# Patient Record
Sex: Female | Born: 1965 | Race: White | Hispanic: No | Smoking: Never smoker
Health system: Southern US, Community
[De-identification: ages and names within clinical notes are randomized; demographics above are authoritative.]

## PROBLEM LIST (undated history)

## (undated) DIAGNOSIS — D649 Anemia, unspecified: Secondary | ICD-10-CM

## (undated) DIAGNOSIS — K635 Polyp of colon: Secondary | ICD-10-CM

## (undated) DIAGNOSIS — N819 Female genital prolapse, unspecified: Secondary | ICD-10-CM

## (undated) DIAGNOSIS — K5090 Crohn's disease, unspecified, without complications: Secondary | ICD-10-CM

## (undated) HISTORY — PX: HERNIA REPAIR: SHX51

## (undated) HISTORY — PX: BREAST SURGERY: SHX581

## (undated) HISTORY — PX: COLPORRHAPHY: SHX921

## (undated) HISTORY — DX: Polyp of colon: K63.5

## (undated) HISTORY — PX: ABDOMINAL HYSTERECTOMY: SHX81

## (undated) HISTORY — PX: MANDIBLE SURGERY: SHX707

---

## 1998-08-05 ENCOUNTER — Other Ambulatory Visit: Admission: RE | Admit: 1998-08-05 | Discharge: 1998-08-05 | Payer: Self-pay | Admitting: Obstetrics and Gynecology

## 1999-02-20 ENCOUNTER — Other Ambulatory Visit: Admission: RE | Admit: 1999-02-20 | Discharge: 1999-02-20 | Payer: Self-pay | Admitting: Obstetrics and Gynecology

## 1999-09-23 ENCOUNTER — Inpatient Hospital Stay (HOSPITAL_COMMUNITY): Admission: AD | Admit: 1999-09-23 | Discharge: 1999-09-25 | Payer: Self-pay | Admitting: Obstetrics and Gynecology

## 1999-10-28 ENCOUNTER — Other Ambulatory Visit: Admission: RE | Admit: 1999-10-28 | Discharge: 1999-10-28 | Payer: Self-pay | Admitting: Obstetrics and Gynecology

## 1999-12-02 ENCOUNTER — Encounter (INDEPENDENT_AMBULATORY_CARE_PROVIDER_SITE_OTHER): Payer: Self-pay

## 1999-12-02 ENCOUNTER — Other Ambulatory Visit: Admission: RE | Admit: 1999-12-02 | Discharge: 1999-12-02 | Payer: Self-pay | Admitting: Obstetrics and Gynecology

## 2000-12-01 ENCOUNTER — Other Ambulatory Visit: Admission: RE | Admit: 2000-12-01 | Discharge: 2000-12-01 | Payer: Self-pay | Admitting: Obstetrics and Gynecology

## 2001-12-04 ENCOUNTER — Other Ambulatory Visit: Admission: RE | Admit: 2001-12-04 | Discharge: 2001-12-04 | Payer: Self-pay | Admitting: Obstetrics & Gynecology

## 2002-02-22 ENCOUNTER — Encounter: Admission: RE | Admit: 2002-02-22 | Discharge: 2002-02-22 | Payer: Self-pay | Admitting: Obstetrics and Gynecology

## 2002-02-22 ENCOUNTER — Encounter: Payer: Self-pay | Admitting: Obstetrics and Gynecology

## 2002-12-06 ENCOUNTER — Other Ambulatory Visit: Admission: RE | Admit: 2002-12-06 | Discharge: 2002-12-06 | Payer: Self-pay | Admitting: Obstetrics & Gynecology

## 2003-03-08 ENCOUNTER — Encounter: Payer: Self-pay | Admitting: Obstetrics and Gynecology

## 2003-03-08 ENCOUNTER — Encounter: Admission: RE | Admit: 2003-03-08 | Discharge: 2003-03-08 | Payer: Self-pay | Admitting: Obstetrics and Gynecology

## 2004-12-18 ENCOUNTER — Ambulatory Visit: Payer: Self-pay | Admitting: Unknown Physician Specialty

## 2008-03-05 ENCOUNTER — Encounter: Admission: RE | Admit: 2008-03-05 | Discharge: 2008-03-05 | Payer: Self-pay | Admitting: Obstetrics and Gynecology

## 2009-05-08 ENCOUNTER — Encounter: Admission: RE | Admit: 2009-05-08 | Discharge: 2009-05-08 | Payer: Self-pay | Admitting: Obstetrics and Gynecology

## 2009-05-16 ENCOUNTER — Ambulatory Visit: Payer: Self-pay | Admitting: Unknown Physician Specialty

## 2009-10-08 ENCOUNTER — Other Ambulatory Visit: Payer: Self-pay | Admitting: Unknown Physician Specialty

## 2009-10-22 ENCOUNTER — Other Ambulatory Visit: Payer: Self-pay

## 2009-11-20 ENCOUNTER — Other Ambulatory Visit: Payer: Self-pay | Admitting: Unknown Physician Specialty

## 2010-01-12 ENCOUNTER — Other Ambulatory Visit: Payer: Self-pay | Admitting: Unknown Physician Specialty

## 2010-02-16 ENCOUNTER — Other Ambulatory Visit: Payer: Self-pay | Admitting: Unknown Physician Specialty

## 2010-04-09 ENCOUNTER — Ambulatory Visit: Payer: Self-pay | Admitting: Internal Medicine

## 2010-04-17 ENCOUNTER — Ambulatory Visit: Payer: Self-pay | Admitting: Internal Medicine

## 2010-05-09 ENCOUNTER — Ambulatory Visit: Payer: Self-pay | Admitting: Internal Medicine

## 2010-05-22 ENCOUNTER — Encounter: Admission: RE | Admit: 2010-05-22 | Discharge: 2010-05-22 | Payer: Self-pay | Admitting: Obstetrics and Gynecology

## 2010-06-09 ENCOUNTER — Ambulatory Visit: Payer: Self-pay | Admitting: Internal Medicine

## 2010-07-10 ENCOUNTER — Ambulatory Visit: Payer: Self-pay | Admitting: Internal Medicine

## 2010-08-09 ENCOUNTER — Ambulatory Visit: Payer: Self-pay | Admitting: Internal Medicine

## 2011-04-20 ENCOUNTER — Other Ambulatory Visit: Payer: Self-pay | Admitting: Obstetrics and Gynecology

## 2011-04-20 DIAGNOSIS — Z1231 Encounter for screening mammogram for malignant neoplasm of breast: Secondary | ICD-10-CM

## 2011-05-25 ENCOUNTER — Ambulatory Visit
Admission: RE | Admit: 2011-05-25 | Discharge: 2011-05-25 | Disposition: A | Discharge status: Home / Self Care | Payer: PRIVATE HEALTH INSURANCE | Source: Ambulatory Visit | Attending: Obstetrics and Gynecology | Admitting: Obstetrics and Gynecology

## 2011-05-25 DIAGNOSIS — Z1231 Encounter for screening mammogram for malignant neoplasm of breast: Secondary | ICD-10-CM

## 2012-06-30 ENCOUNTER — Other Ambulatory Visit: Payer: Self-pay | Admitting: Obstetrics and Gynecology

## 2012-06-30 DIAGNOSIS — Z1231 Encounter for screening mammogram for malignant neoplasm of breast: Secondary | ICD-10-CM

## 2012-08-08 ENCOUNTER — Ambulatory Visit
Admission: RE | Admit: 2012-08-08 | Discharge: 2012-08-08 | Disposition: A | Discharge status: Home / Self Care | Payer: PRIVATE HEALTH INSURANCE | Source: Ambulatory Visit | Attending: Obstetrics and Gynecology | Admitting: Obstetrics and Gynecology

## 2012-08-08 DIAGNOSIS — Z1231 Encounter for screening mammogram for malignant neoplasm of breast: Secondary | ICD-10-CM

## 2012-11-06 ENCOUNTER — Other Ambulatory Visit: Payer: Self-pay | Admitting: Obstetrics and Gynecology

## 2012-11-11 ENCOUNTER — Encounter (HOSPITAL_COMMUNITY): Payer: Self-pay | Admitting: Pharmacy Technician

## 2012-11-13 ENCOUNTER — Encounter (HOSPITAL_COMMUNITY)
Admission: RE | Admit: 2012-11-13 | Discharge: 2012-11-13 | Disposition: A | Discharge status: Home / Self Care | Payer: PRIVATE HEALTH INSURANCE | Source: Ambulatory Visit | Attending: Obstetrics and Gynecology | Admitting: Obstetrics and Gynecology

## 2012-11-13 ENCOUNTER — Encounter (HOSPITAL_COMMUNITY): Payer: Self-pay

## 2012-11-13 HISTORY — DX: Crohn's disease, unspecified, without complications: K50.90

## 2012-11-13 LAB — COMPREHENSIVE METABOLIC PANEL
CO2: 28 mEq/L (ref 19–32)
Calcium: 9.3 mg/dL (ref 8.4–10.5)
Creatinine, Ser: 0.59 mg/dL (ref 0.50–1.10)
GFR calc Af Amer: >90 mL/min (ref >90)
GFR calc non Af Amer: >90 mL/min (ref >90)
Glucose, Bld: 74 mg/dL (ref 70–99)

## 2012-11-13 LAB — CBC
Hemoglobin: 13.9 g/dL (ref 12.0–15.0)
MCH: 30.7 pg (ref 26.0–34.0)
MCV: 94.9 fL (ref 78.0–100.0)
RBC: 4.53 MIL/uL (ref 3.87–5.11)

## 2012-11-13 LAB — SURGICAL PCR SCREEN: Staphylococcus aureus: INVALID — AB

## 2012-11-13 NOTE — Patient Instructions (Addendum)
20 AMBRA HAVERSTICK  11/13/2012   Your procedure is scheduled on:  11/17/12  Enter through the Main Entrance of [facility] at [place] up the phone at the desk and dial [phone].   Call this number if you have problems the morning of surgery: [phone]   Remember:   Do not eat food:After Midnight.  Do not drink clear liquids: After Midnight.  Take these medicines the morning of surgery with A SIP OF WATER: NA   Do not wear jewelry, make-up or nail polish.  Do not wear lotions, powders, or perfumes. You may wear deodorant.  Do not shave 48 hours prior to surgery.  Do not bring valuables to the hospital.  Contacts, dentures or bridgework may not be worn into surgery.  Leave suitcase in the car. After surgery it may be brought to your room.  For patients admitted to the hospital, checkout time is 11:00 AM the day of discharge.   Patients discharged the day of surgery will not be allowed to drive home.  Name and phone number of your driver: NA  Special Instructions: Shower using CHG 2 nights before surgery and the night before surgery.  If you shower the day of surgery use CHG.  Use special wash - you have one bottle of CHG for all showers.  You should use approximately 1/3 of the bottle for each shower.   Please read over the following fact sheets that you were given: MRSA Information

## 2012-11-15 LAB — MRSA CULTURE

## 2012-11-16 NOTE — H&P (Signed)
[name], [name]                ACCOUNT NO.:  [id number]  MEDICAL RECORD NO.:  [id number]  LOCATION:  PERIO                         FACILITY:  [facility]  PHYSICIAN:  Lovenia Kim, M.D.DATE OF BIRTH:  05/20/1966  DATE OF ADMISSION:  09/29/2012 DATE OF DISCHARGE:                             HISTORY & PHYSICAL   CHIEF COMPLAINT:  Stress urinary incontinence, pelvic pain, symptomatic cystocele and rectocele for surgical intervention.  HISTORY OF PRESENT ILLNESS:  She is a 47 year old white female, G2, P2, who presents with aforementioned symptomatology for surgical intervention.  ALLERGIES:  She has allergies to SULFA.  MEDICATIONS:  Calcium, Asacol, and mercaptopurine.  She has a personal history of urinary incontinence, pelvic relaxation as noted.  FAMILY HISTORY:  Neurovascular disease, myocardial infarction, rheumatoid arthritis.  History of vaginal delivery x2.  Surgical history remarkable for laser vaporization surgery.  PHYSICAL EXAMINATION:  GENERAL:  Well-developed, well-nourished white female. VITAL SIGNS:  Height is 65 inches, weight 170 pounds. HEENT:  Normal. NECK:  Supple.  Full range of motion. LUNGS:  Clear to auscultation. HEART:  Regular rhythm. ABDOMEN:  Soft, nontender. PELVIC:  Retroflexed bulky uterus.  No adnexal masses.  Multiple small fibroids noted on ultrasound.  Grade 1-2 cystocele, grade 1-2 rectocele and previous history of positive Q-Tip test. EXTREMITIES:  There are no cords. NEUROLOGIC:  Nonfocal. SKIN:  Intact.  IMPRESSION: 1. Stress urinary incontinence. 2. Pelvic relaxation with cystocele, rectocele. 3. Uterine fibroids.  PLAN:  Proceed with da Vinci total laparoscopic hysterectomy, bilateral salpingectomy, anterior repair, posterior repair, TVT-Exact.  Risks of anesthesia, infection, bleeding, injury to abdominal organs, need for repair was discussed, delayed versus immediate complications to include bowel and bladder injury,  possible need for repair noted.  Small risk of postoperative urge incontinence and urinary retention discussed.  Mesh complications with a 5-4% risk of erosion were stated.  The patient acknowledges and wishes to proceed.     Lovenia Kim, M.D.     RJT/MEDQ  D:  11/16/2012  T:  11/16/2012  Job:  [id number]

## 2012-11-17 ENCOUNTER — Encounter (HOSPITAL_COMMUNITY): Payer: Self-pay | Admitting: Anesthesiology

## 2012-11-17 ENCOUNTER — Encounter (HOSPITAL_COMMUNITY)
Admission: RE | Disposition: A | Discharge status: Home / Self Care | Payer: Self-pay | Source: Ambulatory Visit | Attending: Obstetrics and Gynecology

## 2012-11-17 ENCOUNTER — Ambulatory Visit (HOSPITAL_COMMUNITY)
Admission: RE | Admit: 2012-11-17 | Discharge: 2012-11-18 | Disposition: A | Discharge status: Home / Self Care | Payer: PRIVATE HEALTH INSURANCE | Source: Ambulatory Visit | Attending: Obstetrics and Gynecology | Admitting: Obstetrics and Gynecology

## 2012-11-17 ENCOUNTER — Encounter (HOSPITAL_COMMUNITY): Payer: Self-pay

## 2012-11-17 ENCOUNTER — Ambulatory Visit (HOSPITAL_COMMUNITY): Payer: PRIVATE HEALTH INSURANCE | Admitting: Anesthesiology

## 2012-11-17 DIAGNOSIS — IMO0002 Reserved for concepts with insufficient information to code with codable children: Secondary | ICD-10-CM | POA: Insufficient documentation

## 2012-11-17 DIAGNOSIS — N393 Stress incontinence (female) (male): Secondary | ICD-10-CM | POA: Insufficient documentation

## 2012-11-17 DIAGNOSIS — N812 Incomplete uterovaginal prolapse: Secondary | ICD-10-CM | POA: Insufficient documentation

## 2012-11-17 DIAGNOSIS — N949 Unspecified condition associated with female genital organs and menstrual cycle: Secondary | ICD-10-CM | POA: Insufficient documentation

## 2012-11-17 DIAGNOSIS — N9989 Other postprocedural complications and disorders of genitourinary system: Secondary | ICD-10-CM | POA: Insufficient documentation

## 2012-11-17 DIAGNOSIS — N8000 Endometriosis of the uterus, unspecified: Secondary | ICD-10-CM | POA: Insufficient documentation

## 2012-11-17 DIAGNOSIS — R339 Retention of urine, unspecified: Secondary | ICD-10-CM | POA: Insufficient documentation

## 2012-11-17 DIAGNOSIS — D252 Subserosal leiomyoma of uterus: Secondary | ICD-10-CM | POA: Insufficient documentation

## 2012-11-17 DIAGNOSIS — N815 Vaginal enterocele: Secondary | ICD-10-CM | POA: Insufficient documentation

## 2012-11-17 DIAGNOSIS — Z882 Allergy status to sulfonamides status: Secondary | ICD-10-CM | POA: Insufficient documentation

## 2012-11-17 DIAGNOSIS — D251 Intramural leiomyoma of uterus: Secondary | ICD-10-CM | POA: Insufficient documentation

## 2012-11-17 HISTORY — PX: BILATERAL SALPINGECTOMY: SHX5743

## 2012-11-17 HISTORY — PX: ANTERIOR AND POSTERIOR REPAIR: SHX5121

## 2012-11-17 HISTORY — PX: ROBOTIC ASSISTED TOTAL HYSTERECTOMY: SHX6085

## 2012-11-17 HISTORY — PX: BLADDER SUSPENSION: SHX72

## 2012-11-17 SURGERY — ROBOTIC ASSISTED TOTAL HYSTERECTOMY
Anesthesia: General | Site: Vagina | Wound class: Clean Contaminated

## 2012-11-17 MED ORDER — BUPIVACAINE-EPINEPHRINE 0.25% -1:200000 IJ SOLN
INTRAMUSCULAR | Status: DC | PRN
  Administered 2012-11-17: 90 mL

## 2012-11-17 MED ORDER — NEOSTIGMINE METHYLSULFATE 1 MG/ML IJ SOLN
INTRAMUSCULAR | Status: DC | PRN
  Administered 2012-11-17: 3 mg via INTRAVENOUS

## 2012-11-17 MED ORDER — SCOPOLAMINE 1 MG/3DAYS TD PT72
1.0000 | MEDICATED_PATCH | Freq: Once | TRANSDERMAL | Status: AC | PRN
  Filled 2012-11-17: qty 1

## 2012-11-17 MED ORDER — FENTANYL CITRATE 0.05 MG/ML IJ SOLN
INTRAMUSCULAR | Status: DC | PRN
  Administered 2012-11-17 (×3): 50 ug via INTRAVENOUS
  Administered 2012-11-17: 100 ug via INTRAVENOUS

## 2012-11-17 MED ORDER — ROPIVACAINE HCL 5 MG/ML IJ SOLN
INTRAMUSCULAR | Status: DC | PRN
  Administered 2012-11-17: 70 mL

## 2012-11-17 MED ORDER — KETOROLAC TROMETHAMINE 30 MG/ML IJ SOLN
INTRAMUSCULAR | Status: AC
  Filled 2012-11-17: qty 1

## 2012-11-17 MED ORDER — PROPOFOL 10 MG/ML IV EMUL
INTRAVENOUS | Status: AC
  Filled 2012-11-17: qty 20

## 2012-11-17 MED ORDER — HYDROMORPHONE 0.3 MG/ML IV SOLN
INTRAVENOUS | Status: DC
  Administered 2012-11-17: 12:00:00 via INTRAVENOUS
  Administered 2012-11-17: 1 mg via INTRAVENOUS
  Administered 2012-11-17: 0.3 mg via INTRAVENOUS
  Administered 2012-11-18 (×2): 0.6 mg via INTRAVENOUS
  Filled 2012-11-17: qty 25

## 2012-11-17 MED ORDER — ONDANSETRON HCL 4 MG/2ML IJ SOLN
INTRAMUSCULAR | Status: AC
  Filled 2012-11-17: qty 2

## 2012-11-17 MED ORDER — ACETAMINOPHEN 10 MG/ML IV SOLN
1000.0000 mg | Freq: Once | INTRAVENOUS | Status: AC
  Administered 2012-11-17: 1000 mg via INTRAVENOUS

## 2012-11-17 MED ORDER — LACTATED RINGERS IV SOLN
INTRAVENOUS | Status: DC
  Administered 2012-11-17 (×4): via INTRAVENOUS

## 2012-11-17 MED ORDER — ZOLPIDEM TARTRATE 5 MG PO TABS: 5.0000 mg | ORAL_TABLET | Freq: Every evening | ORAL | Status: DC | PRN

## 2012-11-17 MED ORDER — MIDAZOLAM HCL 5 MG/5ML IJ SOLN
INTRAMUSCULAR | Status: DC | PRN
  Administered 2012-11-17: 2 mg via INTRAVENOUS

## 2012-11-17 MED ORDER — CEFAZOLIN SODIUM-DEXTROSE 2-3 GM-% IV SOLR
INTRAVENOUS | Status: AC
  Filled 2012-11-17: qty 50

## 2012-11-17 MED ORDER — DIPHENHYDRAMINE HCL 50 MG/ML IJ SOLN: 12.5000 mg | Freq: Four times a day (QID) | INTRAMUSCULAR | Status: DC | PRN

## 2012-11-17 MED ORDER — PHENYLEPHRINE HCL 10 MG/ML IJ SOLN
INTRAMUSCULAR | Status: DC | PRN
  Administered 2012-11-17: 40 ug via INTRAVENOUS

## 2012-11-17 MED ORDER — ACETAMINOPHEN 10 MG/ML IV SOLN
INTRAVENOUS | Status: AC
  Filled 2012-11-17: qty 100

## 2012-11-17 MED ORDER — LIDOCAINE HCL (CARDIAC) 20 MG/ML IV SOLN
INTRAVENOUS | Status: AC
  Filled 2012-11-17: qty 5

## 2012-11-17 MED ORDER — ROCURONIUM BROMIDE 50 MG/5ML IV SOLN
INTRAVENOUS | Status: AC
  Filled 2012-11-17: qty 1

## 2012-11-17 MED ORDER — SCOPOLAMINE 1 MG/3DAYS TD PT72
1.0000 | MEDICATED_PATCH | TRANSDERMAL | Status: DC
  Administered 2012-11-17: 1.5 mg via TRANSDERMAL

## 2012-11-17 MED ORDER — DEXAMETHASONE SODIUM PHOSPHATE 4 MG/ML IJ SOLN
INTRAMUSCULAR | Status: DC | PRN
  Administered 2012-11-17: 5 mg via INTRAVENOUS

## 2012-11-17 MED ORDER — TRAMADOL HCL 50 MG PO TABS
50.0000 mg | ORAL_TABLET | Freq: Four times a day (QID) | ORAL | Status: DC | PRN
  Administered 2012-11-18: 50 mg via ORAL
  Filled 2012-11-17: qty 1

## 2012-11-17 MED ORDER — LUBRIFRESH P.M. OP OINT
TOPICAL_OINTMENT | OPHTHALMIC | Status: DC | PRN
  Administered 2012-11-17: 1 via OPHTHALMIC

## 2012-11-17 MED ORDER — VASOPRESSIN 20 UNIT/ML IJ SOLN
INTRAMUSCULAR | Status: AC
  Filled 2012-11-17: qty 1

## 2012-11-17 MED ORDER — ESTRADIOL 0.1 MG/GM VA CREA
TOPICAL_CREAM | VAGINAL | Status: AC
  Filled 2012-11-17: qty 42.5

## 2012-11-17 MED ORDER — LACTATED RINGERS IR SOLN
Status: DC | PRN
  Administered 2012-11-17: 3000 mL

## 2012-11-17 MED ORDER — OXYCODONE-ACETAMINOPHEN 5-325 MG PO TABS
1.0000 | ORAL_TABLET | ORAL | Status: DC | PRN
  Administered 2012-11-18: 1 via ORAL
  Filled 2012-11-17: qty 1

## 2012-11-17 MED ORDER — CEFAZOLIN SODIUM-DEXTROSE 2-3 GM-% IV SOLR
2.0000 g | INTRAVENOUS | Status: AC
  Administered 2012-11-17: 2 g via INTRAVENOUS

## 2012-11-17 MED ORDER — HYDROMORPHONE HCL PF 1 MG/ML IJ SOLN: 0.2500 mg | INTRAMUSCULAR | Status: DC | PRN

## 2012-11-17 MED ORDER — ROCURONIUM BROMIDE 100 MG/10ML IV SOLN
INTRAVENOUS | Status: DC | PRN
  Administered 2012-11-17: 20 mg via INTRAVENOUS
  Administered 2012-11-17: 50 mg via INTRAVENOUS

## 2012-11-17 MED ORDER — ONDANSETRON HCL 4 MG/2ML IJ SOLN: 4.0000 mg | Freq: Four times a day (QID) | INTRAMUSCULAR | Status: DC | PRN

## 2012-11-17 MED ORDER — BUPIVACAINE HCL (PF) 0.25 % IJ SOLN
INTRAMUSCULAR | Status: AC
  Filled 2012-11-17: qty 30

## 2012-11-17 MED ORDER — ARTIFICIAL TEARS OP OINT
TOPICAL_OINTMENT | OPHTHALMIC | Status: AC
  Filled 2012-11-17: qty 3.5

## 2012-11-17 MED ORDER — DEXAMETHASONE SODIUM PHOSPHATE 10 MG/ML IJ SOLN
INTRAMUSCULAR | Status: AC
  Filled 2012-11-17: qty 1

## 2012-11-17 MED ORDER — BUPIVACAINE-EPINEPHRINE PF 0.25-1:200000 % IJ SOLN
INTRAMUSCULAR | Status: AC
  Filled 2012-11-17: qty 30

## 2012-11-17 MED ORDER — FENTANYL CITRATE 0.05 MG/ML IJ SOLN
INTRAMUSCULAR | Status: AC
  Filled 2012-11-17: qty 5

## 2012-11-17 MED ORDER — GLYCOPYRROLATE 0.2 MG/ML IJ SOLN
INTRAMUSCULAR | Status: DC | PRN
  Administered 2012-11-17: 0.4 mg via INTRAVENOUS

## 2012-11-17 MED ORDER — ONDANSETRON HCL 4 MG/2ML IJ SOLN
INTRAMUSCULAR | Status: DC | PRN
  Administered 2012-11-17: 4 mg via INTRAVENOUS

## 2012-11-17 MED ORDER — HYDROMORPHONE HCL PF 1 MG/ML IJ SOLN
INTRAMUSCULAR | Status: DC | PRN
  Administered 2012-11-17 (×2): 0.5 mg via INTRAVENOUS

## 2012-11-17 MED ORDER — PROPOFOL 10 MG/ML IV EMUL
INTRAVENOUS | Status: DC | PRN
  Administered 2012-11-17: 150 mg via INTRAVENOUS

## 2012-11-17 MED ORDER — NALOXONE HCL 0.4 MG/ML IJ SOLN: 0.4000 mg | INTRAMUSCULAR | Status: DC | PRN

## 2012-11-17 MED ORDER — MERCAPTOPURINE 50 MG PO TABS
50.0000 mg | ORAL_TABLET | Freq: Every day | ORAL | Status: DC
  Administered 2012-11-18: 50 mg via ORAL
  Filled 2012-11-17 (×3): qty 1

## 2012-11-17 MED ORDER — BUPIVACAINE HCL (PF) 0.25 % IJ SOLN
INTRAMUSCULAR | Status: DC | PRN
  Administered 2012-11-17: 16 mL

## 2012-11-17 MED ORDER — MIDAZOLAM HCL 2 MG/2ML IJ SOLN
INTRAMUSCULAR | Status: AC
  Filled 2012-11-17: qty 2

## 2012-11-17 MED ORDER — SODIUM CHLORIDE 0.9 % IJ SOLN: 9.0000 mL | INTRAMUSCULAR | Status: DC | PRN

## 2012-11-17 MED ORDER — DEXTROSE IN LACTATED RINGERS 5 % IV SOLN
INTRAVENOUS | Status: DC
  Administered 2012-11-17 – 2012-11-18 (×2): via INTRAVENOUS

## 2012-11-17 MED ORDER — ROPIVACAINE HCL 5 MG/ML IJ SOLN
INTRAMUSCULAR | Status: AC
  Filled 2012-11-17: qty 60

## 2012-11-17 MED ORDER — MESALAMINE 800 MG PO TBEC
2400.0000 mg | DELAYED_RELEASE_TABLET | Freq: Every day | ORAL | Status: DC
  Administered 2012-11-18: 2400 mg via ORAL

## 2012-11-17 MED ORDER — ACETAMINOPHEN 10 MG/ML IV SOLN: 1000.0000 mg | Freq: Once | INTRAVENOUS | Status: DC | PRN

## 2012-11-17 MED ORDER — DIPHENHYDRAMINE HCL 12.5 MG/5ML PO ELIX: 12.5000 mg | ORAL_SOLUTION | Freq: Four times a day (QID) | ORAL | Status: DC | PRN

## 2012-11-17 MED ORDER — KETOROLAC TROMETHAMINE 30 MG/ML IJ SOLN: 15.0000 mg | Freq: Once | INTRAMUSCULAR | Status: DC | PRN

## 2012-11-17 MED ORDER — LIDOCAINE HCL (CARDIAC) 20 MG/ML IV SOLN
INTRAVENOUS | Status: DC | PRN
  Administered 2012-11-17: 30 mg via INTRAVENOUS

## 2012-11-17 MED ORDER — KETOROLAC TROMETHAMINE 30 MG/ML IJ SOLN
INTRAMUSCULAR | Status: DC | PRN
  Administered 2012-11-17: 30 mg via INTRAVENOUS

## 2012-11-17 MED ORDER — INDIGOTINDISULFONATE SODIUM 8 MG/ML IJ SOLN
INTRAMUSCULAR | Status: AC
  Filled 2012-11-17: qty 5

## 2012-11-17 MED ORDER — HYDROMORPHONE HCL PF 1 MG/ML IJ SOLN
INTRAMUSCULAR | Status: AC
  Filled 2012-11-17: qty 1

## 2012-11-17 MED ORDER — SODIUM CHLORIDE 0.9 % IJ SOLN
INTRAMUSCULAR | Status: DC | PRN
  Administered 2012-11-17: 10 mL

## 2012-11-17 MED ORDER — STERILE WATER FOR IRRIGATION IR SOLN
Status: DC | PRN
  Administered 2012-11-17: 1000 mL

## 2012-11-17 MED FILL — White Petrolatum-Mineral Oil Ophth Ointment: OPHTHALMIC | Qty: 3.5 | Status: AC

## 2012-11-17 SURGICAL SUPPLY — 82 items
BAG URINE DRAINAGE (UROLOGICAL SUPPLIES) ×6 IMPLANT
BARRIER ADHS 3X4 INTERCEED (GAUZE/BANDAGES/DRESSINGS) ×6 IMPLANT
BLADE SURG 15 STRL LF C SS BP (BLADE) ×10 IMPLANT
BLADE SURG 15 STRL SS (BLADE) ×2
CANISTER SUCTION 2500CC (MISCELLANEOUS) ×6 IMPLANT
CATH FOLEY 2WAY SLVR  5CC 18FR (CATHETERS) ×2
CATH FOLEY 2WAY SLVR 5CC 18FR (CATHETERS) ×10 IMPLANT
CATH FOLEY 3WAY  5CC 16FR (CATHETERS) ×1
CATH FOLEY 3WAY 5CC 16FR (CATHETERS) ×5 IMPLANT
CHLORAPREP W/TINT 26ML (MISCELLANEOUS) ×6 IMPLANT
CLOTH BEACON ORANGE TIMEOUT ST (SAFETY) ×6 IMPLANT
CONT PATH 16OZ SNAP LID 3702 (MISCELLANEOUS) ×6 IMPLANT
COVER MAYO STAND STRL (DRAPES) ×6 IMPLANT
COVER TABLE BACK 60X90 (DRAPES) ×12 IMPLANT
COVER TIP SHEARS 8 DVNC (MISCELLANEOUS) ×5 IMPLANT
COVER TIP SHEARS 8MM DA VINCI (MISCELLANEOUS) ×1
DECANTER SPIKE VIAL GLASS SM (MISCELLANEOUS) ×6 IMPLANT
DERMABOND ADVANCED (GAUZE/BANDAGES/DRESSINGS) ×2
DERMABOND ADVANCED .7 DNX12 (GAUZE/BANDAGES/DRESSINGS) ×10 IMPLANT
DRAPE HUG U DISPOSABLE (DRAPE) ×6 IMPLANT
DRAPE HYSTEROSCOPY (DRAPE) ×6 IMPLANT
DRAPE LG THREE QUARTER DISP (DRAPES) ×12 IMPLANT
DRAPE WARM FLUID 44X44 (DRAPE) ×6 IMPLANT
ELECT NEEDLE TIP 2.8 STRL (NEEDLE) IMPLANT
ELECT REM PT RETURN 9FT ADLT (ELECTROSURGICAL) ×6
ELECTRODE REM PT RTRN 9FT ADLT (ELECTROSURGICAL) ×5 IMPLANT
EVACUATOR SMOKE 8.L (FILTER) ×6 IMPLANT
GAUZE VASELINE 3X9 (GAUZE/BANDAGES/DRESSINGS) IMPLANT
GLOVE BIO SURGEON STRL SZ7.5 (GLOVE) ×12 IMPLANT
GOWN PREVENTION PLUS LG XLONG (DISPOSABLE) ×12 IMPLANT
GOWN PREVENTION PLUS XLARGE (GOWN DISPOSABLE) ×6 IMPLANT
GOWN STRL REIN XL XLG (GOWN DISPOSABLE) ×36 IMPLANT
GYRUS RUMI II 2.5CM BLUE (DISPOSABLE)
GYRUS RUMI II 3.5CM BLUE (DISPOSABLE)
GYRUS RUMI II 4.0CM BLUE (DISPOSABLE)
KIT ACCESSORY DA VINCI DISP (KITS) ×1
KIT ACCESSORY DVNC DISP (KITS) ×5 IMPLANT
LEGGING LITHOTOMY PAIR STRL (DRAPES) ×6 IMPLANT
NEEDLE HYPO 22GX1.5 SAFETY (NEEDLE) IMPLANT
NEEDLE INSUFFLATION 120MM (ENDOMECHANICALS) ×6 IMPLANT
NEEDLE INSUFFLATION 150MM (ENDOMECHANICALS) ×6 IMPLANT
NS IRRIG 1000ML POUR BTL (IV SOLUTION) ×6 IMPLANT
PACK LAVH (CUSTOM PROCEDURE TRAY) ×6 IMPLANT
PACK VAGINAL WOMENS (CUSTOM PROCEDURE TRAY) ×6 IMPLANT
PAD PREP 24X48 CUFFED NSTRL (MISCELLANEOUS) ×12 IMPLANT
PLUG CATH AND CAP STER (CATHETERS) ×6 IMPLANT
PROTECTOR NERVE ULNAR (MISCELLANEOUS) ×12 IMPLANT
RUMI II 3.0CM BLUE KOH-EFFICIE (DISPOSABLE) ×6 IMPLANT
RUMI II GYRUS 2.5CM BLUE (DISPOSABLE) IMPLANT
RUMI II GYRUS 3.5CM BLUE (DISPOSABLE) IMPLANT
RUMI II GYRUS 4.0CM BLUE (DISPOSABLE) IMPLANT
SET CYSTO W/LG BORE CLAMP LF (SET/KITS/TRAYS/PACK) ×6 IMPLANT
SET IRRIG TUBING LAPAROSCOPIC (IRRIGATION / IRRIGATOR) ×6 IMPLANT
SLING TVT EXACT (Sling) ×6 IMPLANT
SOLUTION ELECTROLUBE (MISCELLANEOUS) ×6 IMPLANT
SUT MON AB 2-0 CT2 27 (SUTURE) IMPLANT
SUT VIC AB 0 CT1 27 (SUTURE) ×3
SUT VIC AB 0 CT1 27XBRD ANBCTR (SUTURE) ×15 IMPLANT
SUT VIC AB 0 CT1 27XBRD ANTBC (SUTURE) IMPLANT
SUT VIC AB 2-0 CT1 (SUTURE) ×24 IMPLANT
SUT VICRYL 0 UR6 27IN ABS (SUTURE) ×12 IMPLANT
SUT VICRYL 3 0 CT 3 (SUTURE) IMPLANT
SUT VICRYL RAPIDE 3 0 (SUTURE) ×18 IMPLANT
SUT VICRYL RAPIDE 4/0 PS 2 (SUTURE) ×6 IMPLANT
SUT VLOC 180 0 9IN  GS21 (SUTURE) ×1
SUT VLOC 180 0 9IN GS21 (SUTURE) ×5 IMPLANT
SYR 50ML LL SCALE MARK (SYRINGE) ×6 IMPLANT
SYRINGE 10CC LL (SYRINGE) ×6 IMPLANT
SYSTEM CONVERTIBLE TROCAR (TROCAR) IMPLANT
TIP UTERINE 6.7X8CM BLUE DISP (MISCELLANEOUS) ×6 IMPLANT
TOWEL OR 17X24 6PK STRL BLUE (TOWEL DISPOSABLE) ×18 IMPLANT
TRAY FOLEY BAG SILVER LF 16FR (CATHETERS) ×6 IMPLANT
TRAY FOLEY CATH 14FR (SET/KITS/TRAYS/PACK) ×6 IMPLANT
TROCAR BLADELESS OPT 12M 100M (ENDOMECHANICALS) IMPLANT
TROCAR DILATING TIP 12MM 150MM (ENDOMECHANICALS) ×6 IMPLANT
TROCAR DISP BLADELESS 8 DVNC (TROCAR) ×5 IMPLANT
TROCAR DISP BLADELESS 8MM (TROCAR) ×1
TROCAR XCEL 12X100 BLDLESS (ENDOMECHANICALS) ×6 IMPLANT
TROCAR XCEL NON-BLD 5MMX100MML (ENDOMECHANICALS) ×6 IMPLANT
TUBING FILTER THERMOFLATOR (ELECTROSURGICAL) ×6 IMPLANT
WARMER LAPAROSCOPE (MISCELLANEOUS) ×6 IMPLANT
WATER STERILE IRR 1000ML POUR (IV SOLUTION) ×18 IMPLANT

## 2012-11-17 NOTE — Transfer of Care (Signed)
Immediate Anesthesia Transfer of Care Note  Patient: [name]  Procedure(s) Performed: Procedure(s) with comments: ROBOTIC ASSISTED TOTAL HYSTERECTOMY (N/A) - Requests 3 1/2 hrs. BILATERAL SALPINGECTOMY (Bilateral) TRANSVAGINAL TAPE (TVT) PROCEDURE Sling with Cystoscopy. (N/A) - TVT Sling with Cystoscopy. ANTERIOR (CYSTOCELE) AND POSTERIOR (RECTOCELE), Enterocele repair. (N/A) - Also Enterocele repair.  Patient Location: PACU  Anesthesia Type:General  Level of Consciousness: awake and patient cooperative  Airway & Oxygen Therapy: Patient Spontanous Breathing and Patient connected to nasal cannula oxygen  Post-op Assessment: Report given to PACU RN and Post -op Vital signs reviewed and stable  Post vital signs: Reviewed and stable  Complications: No apparent anesthesia complications

## 2012-11-17 NOTE — Op Note (Signed)
11/17/2012  10:24 AM  PATIENT:  [name]  48 y.o. female  PRE-OPERATIVE DIAGNOSIS:  Uterine fibroids, Cystocele, Rectocele, SUI, Enterocele Pelvic Pain   POST-OPERATIVE DIAGNOSIS:  same  PROCEDURE:  Procedure(s): ROBOTIC ASSISTED TOTAL HYSTERECTOMY BILATERAL SALPINGECTOMY TRANSVAGINAL TAPE (TVT) PROCEDURE Sling with Cystoscopy. ANTERIOR (CYSTOCELE) AND POSTERIOR (RECTOCELE), Enterocele repair. MCCALL CUL DE PLASTY LYSIS OF ADHESIONS  SURGEON:  Surgeon(s): Lovenia Kim, MD Floyce Stakes. Pamala Hurry, MD  ASSISTANTSRonita Hipps  ANESTHESIA:   local and general  ESTIMATED BLOOD LOSS: * No blood loss amount entered *   DRAINS: Urinary Catheter (Foley)   LOCAL MEDICATIONS USED:  MARCAINE     SPECIMEN:  Source of Specimen:  uterus, cervix, bilateral tubes  DISPOSITION OF SPECIMEN:  PATHOLOGY  COUNTS:  YES  DICTATION #: [id number]  PLAN OF CARE: DC home  PATIENT DISPOSITION:  PACU - hemodynamically stable.

## 2012-11-17 NOTE — Anesthesia Postprocedure Evaluation (Signed)
  Anesthesia Post-op Note  Patient: [name]  Procedure(s) Performed: Procedure(s) with comments: ROBOTIC ASSISTED TOTAL HYSTERECTOMY (N/A) - Requests 3 1/2 hrs. BILATERAL SALPINGECTOMY (Bilateral) TRANSVAGINAL TAPE (TVT) PROCEDURE Sling with Cystoscopy. (N/A) - TVT Sling with Cystoscopy. ANTERIOR (CYSTOCELE) AND POSTERIOR (RECTOCELE), Enterocele repair. (N/A) - Also Enterocele repair.  Patient Location: PACU  Anesthesia Type:General  Level of Consciousness: awake, alert  and oriented  Airway and Oxygen Therapy: Patient Spontanous Breathing  Post-op Pain: mild  Post-op Assessment: Post-op Vital signs reviewed, Patient's Cardiovascular Status Stable, Respiratory Function Stable, Patent Airway, No signs of Nausea or vomiting and Pain level controlled  Post-op Vital Signs: Reviewed and stable  Complications: No apparent anesthesia complications

## 2012-11-17 NOTE — Progress Notes (Signed)
Patient ID: [name], female   DOB: 06/11/1966, 47 y.o.   MRN: [id number] Patient seen and examined. Consent witnessed and signed. No changes noted. Update completed.

## 2012-11-17 NOTE — Plan of Care (Signed)
Problem: Discharge Progression Outcomes Goal: Discontinue staples (if applicable) Outcome: Not Applicable Date Met:  33/29/51 Skin glue to all incisions.

## 2012-11-17 NOTE — Anesthesia Preprocedure Evaluation (Addendum)
Anesthesia Evaluation  Patient identified by MRN, date of birth, ID band Patient awake    Reviewed: Allergy & Precautions, H&P , NPO status , Patient's Chart, lab work & pertinent test results, reviewed documented beta blocker date and time   History of Anesthesia Complications (+) PONV  Airway Mallampati: II TM Distance: >3 FB Neck ROM: full    Dental  (+) Teeth Intact   Pulmonary neg pulmonary ROS,  breath sounds clear to auscultation  Pulmonary exam normal       Cardiovascular Exercise Tolerance: Good negative cardio ROS  Rhythm:regular Rate:Normal     Neuro/Psych negative neurological ROS  negative psych ROS   GI/Hepatic Neg liver ROS, Bowel prep,Crohn's disease - managed with with medicine   Endo/Other  negative endocrine ROS  Renal/GU negative Renal ROS Bladder dysfunction Female GU complaint     Musculoskeletal   Abdominal   Peds  Hematology negative hematology ROS (+)   Anesthesia Other Findings   Reproductive/Obstetrics negative OB ROS                          Anesthesia Physical Anesthesia Plan  ASA: II  Anesthesia Plan: General ETT   Post-op Pain Management:    Induction:   Airway Management Planned:   Additional Equipment:   Intra-op Plan:   Post-operative Plan:   Informed Consent: I have reviewed the patients History and Physical, chart, labs and discussed the procedure including the risks, benefits and alternatives for the proposed anesthesia with the patient or authorized representative who has indicated his/her understanding and acceptance.   Dental Advisory Given  Plan Discussed with: CRNA and Surgeon  Anesthesia Plan Comments:         Anesthesia Quick Evaluation

## 2012-11-17 NOTE — Plan of Care (Signed)
Problem: Phase I Progression Outcomes Goal: Pain controlled with appropriate interventions Outcome: Completed/Met Date Met:  11/17/12 Good pain control on PCA Dilaudid Goal: Dangle/OOB as tolerated per MD order Outcome: Completed/Met Date Met:  11/17/12 Walked patient out in the hall,she tolerated this well. Goal: VS, stable, temp < 100.4 degrees F Outcome: Completed/Met Date Met:  11/17/12 VSS at this time Goal: I & O every 4 hrs or as ordered Outcome: Completed/Met Date Met:  11/17/12 Foley to S/D clear yellow urine,good amount. Goal: IS, TCDB as ordered Outcome: Completed/Met Date Met:  11/17/12 Can get I/S up to 2250,good non productive cough,lungs clear.Moves well with assist. Goal: Other Phase I Outcomes/Goals Outcome: Not Applicable Date Met:  60/63/01 Has not voided,Foley to S/D

## 2012-11-18 LAB — CBC
Hemoglobin: 11.1 g/dL — ABNORMAL LOW (ref 12.0–15.0)
MCH: 30.7 pg (ref 26.0–34.0)
MCHC: 32.9 g/dL (ref 30.0–36.0)
MCV: 93.4 fL (ref 78.0–100.0)
Platelets: 188 10*3/uL (ref 150–400)
RBC: 3.61 MIL/uL — ABNORMAL LOW (ref 3.87–5.11)

## 2012-11-18 LAB — COMPREHENSIVE METABOLIC PANEL
CO2: 28 mEq/L (ref 19–32)
Calcium: 8.4 mg/dL (ref 8.4–10.5)
Creatinine, Ser: 0.62 mg/dL (ref 0.50–1.10)
GFR calc Af Amer: >90 mL/min (ref >90)
GFR calc non Af Amer: >90 mL/min (ref >90)
Glucose, Bld: 122 mg/dL — ABNORMAL HIGH (ref 70–99)
Total Protein: 5.1 g/dL — ABNORMAL LOW (ref 6.0–8.3)

## 2012-11-18 MED ORDER — OXYCODONE-ACETAMINOPHEN 5-325 MG PO TABS: 1.0000 | ORAL_TABLET | ORAL | Status: DC | PRN

## 2012-11-18 MED ORDER — TRAMADOL HCL 50 MG PO TABS: 50.0000 mg | ORAL_TABLET | Freq: Four times a day (QID) | ORAL | Status: DC | PRN

## 2012-11-18 NOTE — Anesthesia Postprocedure Evaluation (Signed)
  Anesthesia Post-op Note  Patient: [name]  Procedure(s) Performed: Procedure(s) with comments: ROBOTIC ASSISTED TOTAL HYSTERECTOMY (N/A) - Requests 3 1/2 hrs. BILATERAL SALPINGECTOMY (Bilateral) TRANSVAGINAL TAPE (TVT) PROCEDURE Sling with Cystoscopy. (N/A) - TVT Sling with Cystoscopy. ANTERIOR (CYSTOCELE) AND POSTERIOR (RECTOCELE), Enterocele repair. (N/A) - Also Enterocele repair.  Patient Location: PACU and Women's Unit  Anesthesia Type:General  Level of Consciousness: awake, alert  and oriented  Airway and Oxygen Therapy: Patient Spontanous Breathing  Post-op Pain: mild  Post-op Assessment: Patient's Cardiovascular Status Stable, Respiratory Function Stable, No signs of Nausea or vomiting, Adequate PO intake and Pain level controlled  Post-op Vital Signs: Reviewed  Complications: No apparent anesthesia complications

## 2012-11-18 NOTE — Progress Notes (Signed)
1 Day Post-Op Procedure(s) (LRB): ROBOTIC ASSISTED TOTAL HYSTERECTOMY (N/A) BILATERAL SALPINGECTOMY (Bilateral) TRANSVAGINAL TAPE (TVT) PROCEDURE Sling with Cystoscopy. (N/A) ANTERIOR (CYSTOCELE) AND POSTERIOR (RECTOCELE), Enterocele repair. (N/A)  Subjective: Patient reports nausea, incisional pain, tolerating PO and + flatus.   Able to urinate but PVR greater than 200 on bladder scan  Objective: I have reviewed patient's vital signs, intake and output, medications and labs.  General: alert, cooperative and appears stated age Resp: clear to auscultation bilaterally and normal percussion bilaterally Cardio: regular rate and rhythm, S1, S2 normal, no murmur, click, rub or gallop and normal apical impulse GI: soft, non-tender; bowel sounds normal; no masses,  no organomegaly, normal findings: aorta normal, no bruits heard and no masses palpable and incision: clean, dry and intact Extremities: extremities normal, atraumatic, no cyanosis or edema and Homans sign is negative, no sign of DVT Vaginal Bleeding: minimal  Assessment: s/p Procedure(s) with comments: ROBOTIC ASSISTED TOTAL HYSTERECTOMY (N/A) - Requests 3 1/2 hrs. BILATERAL SALPINGECTOMY (Bilateral) TRANSVAGINAL TAPE (TVT) PROCEDURE Sling with Cystoscopy. (N/A) - TVT Sling with Cystoscopy. ANTERIOR (CYSTOCELE) AND POSTERIOR (RECTOCELE), Enterocele repair. (N/A) - Also Enterocele repair.: stable, progressing well and tolerating diet Urinary Retention  Plan: Will try to void- rpt bladder scan. Possible dc home with foley and fu in office in 48hrs.  Advance diet Encourage ambulation Discontinue IV fluids Discharge home  LOS: 1 day    [name] 11/18/2012, 7:42 AM

## 2012-11-18 NOTE — Progress Notes (Signed)
Discharge instructions provided to patient and significant other at bedside.  Activity instructions, medications, follow up appointments, when to call the doctor, foley catheter care and community resources discusses.  No questions at this time.  Patient left unit accompanied by staff in stable condition with all personal belongings.  Leighton Roach, RN--------

## 2012-11-18 NOTE — Progress Notes (Signed)
Patient Foley removed this AM at 0800.Large amt of clear yellow urine drained all night.300cc of sterile NS instilled into bladder prior to removal.Patient aware to call when she needs to void.

## 2012-11-18 NOTE — Op Note (Signed)
[name], [name]                ACCOUNT NO.:  [id number]  MEDICAL RECORD NO.:  [id number]  LOCATION:  4536                          FACILITY:  [facility]  PHYSICIAN:  Lovenia Kim, M.D.DATE OF BIRTH:  03/04/1966  DATE OF PROCEDURE:  11/17/2012 DATE OF DISCHARGE:                              OPERATIVE REPORT   DESCRIPTION OF PROCEDURE:  After being apprised of the risks of anesthesia, infection, bleeding, intra-abdominal organs and possible need for repair, delayed versus immediate complications to include bowel and bladder injury, possible need for repair, small instance of the mesh erosion due to placement of TVT for stress urinary incontinence, of approximately 1%:5% with secondary dyspareunia, the patient was brought to the operating room.  Consents were signed.  She was placed in dorsal lithotomy position, and after achieving adequate general anesthesia, she was prepped and draped in the usual sterile fashion.  Foley catheter was placed.  Exam under anesthesia revealed a bulky mid positioned uterus and no adnexal masses.  She has a grade 2 cystocele, grade 2 rectocele, and enterocele as noted.  At this time, feet were placed in [place].  RUMI retractor was placed per vagina in the standard fashion after the Foley catheter was placed.  Infraumbilical incision was made with a scalpel.  Veress needle was placed with opening pressure of 2. 3.5 L of CO2 was insufflated without difficulty.  Trocar was placed atraumatically.  Visualization revealed atraumatic trocar entry. Pictures were taken.  Normal liver, gallbladder bed, normal-appearing appendix.  The uterus appears to be bulky, mid positioned.  There was bilateral normal adnexa noted.  The left and right ureters were identified.  The robotic ports were placed, 1 on the left, 1 on the right, and a 5-mm assistant port on the left.  The robot was then docked after achieving steep Trendelenburg position in a standard  fashion.  The PK forceps and Endo Shears were placed.  At this time, the procedure was initiated whereby the left ureter was identified.  The retroperitoneal space was entered on the left.  The ureter was identified along the medial leaf of the peritoneum.  The mesosalpinx was then cauterized at separating the left tube.  The tubo-ovarian ligaments on the left was divided.  The retroperitoneal space was further developed.  The bladder ligament was opened.  The bladder flap was developed sharply and the uterine vessels were skeletonized on the left, cauterized and divided on the right side.  The ureter was identified.  The retroperitoneal space was entered behind the round ligament.  The ureters were noted to be peristalsing normally along the medial leaf of the peritoneum.  The tubo- ovarian ligament on the right was cauterized and cut in a 3-point method.  The round ligament was cauterized and cut.  The bladder flap was further developed sharply, and the RUMI cup was identified.  The uterine vessels on the right were skeletonized, cauterized, and divided. Then, there were cauterized and divided on the left.  The specimen was detached circumferentially along the cervicovaginal junction and retracted into the vagina.  The vagina was closed using a 0 V-Loc suture in a continuous running fashion, uterosacral plication and McCall culdoplasty  sutures were placed in the standard fashion to correct the enterocele.  At this time, good hemostasis was noted.  Irrigation was accomplished.  The ureters were noted to be peristalsing normally and bilaterally.  All instruments were removed.  Incisions were closed using 0 Vicryl, 4-0 Vicryl Dermabond.  At this time, attention turned to vaginal portion of the procedure where the vaginal incision was well approximated and well supported.  The anterior vaginal wall and the cystocele were dressed making a linear incision in the peritoneum and vaginal mucosa.   The pubocervical fascia was then dissected sharply off the vaginal mucosa.  The cystocele was reduced and imbricated using a 2- 0 Vicryl suture, and closure of the defect was performed using a 2-0 Vicryl suture in a continuous running fashion.  A small mid urethral incision was made over the vagina.  Tunnels were then created along the right and left side and placement for the TVT.  Abdominal marks were made lateral 2 cm to the midline, just above the symphysis pubis for trocar exit.  The TVT tape was then placed on the right side first, and then on the left side in a standard fashion.  Cystoscopy was performed. Incidental cystotomy at 1 to 2 o'clock was noted on the left.  The trocar was then removed.  The bladder was empty, and the trocar was then replaced in the TVT tape with the TVT tape attached on the left side atraumatically.  Cystoscopy then reveals a normal bladder contour.  No evidence of perforation.  Bilateral ureteral openings were seen, and good efflux of urine was noted from both ureteral openings.  At this time, the TVT tape was then seated in the standard fashion.  Placement of a right angle clamp to maintain the loose approximation of the tape over the mid urethral portion was noted.  The tape was then cut, and the skin was closed using Dermabond.  The vaginal incision was closed using a 3-0 Vicryl in a continuous running fashion, and posterior repair and perineorrhaphy is then performed, removing a triangular shaped portion of suture at the perineum and triangular portion of tissue at the perineum.  The cephalad portion of the rectocele was identified by doing the rectal exam.  A linear incision was made of the posterior vaginal mucosa.  There was a rectocele, which was identified and dissection of the vaginal mucosa, the posterior of the perirectal fascia was performed.  The rectocele was imbricated using 0 Vicryl suture with the finger in the rectum to assure no  rectal penetration of the suture. Four interrupted 0 Vicryl sutures were placed with the levator stitch as well and all are tied in the standard fashion.  The vaginal mucosa was then closed with a 2-0 Vicryl, and the perineorrhaphy was closed using a 2-0 Vicryl.  Good hemostasis was noted.  The patient tolerated the procedure well.  Urine was clear.  She was transferred to recovery in good condition.     Lovenia Kim, M.D.     RJT/MEDQ  D:  11/17/2012  T:  11/18/2012  Job:  [id number]

## 2012-11-20 ENCOUNTER — Encounter (HOSPITAL_COMMUNITY): Payer: Self-pay | Admitting: Obstetrics and Gynecology

## 2013-05-18 ENCOUNTER — Ambulatory Visit: Payer: Self-pay | Admitting: Unknown Physician Specialty

## 2013-07-04 ENCOUNTER — Ambulatory Visit: Payer: Self-pay | Admitting: Orthopedic Surgery

## 2013-08-15 ENCOUNTER — Other Ambulatory Visit: Payer: Self-pay

## 2013-08-15 DIAGNOSIS — Z1231 Encounter for screening mammogram for malignant neoplasm of breast: Secondary | ICD-10-CM

## 2013-09-05 ENCOUNTER — Ambulatory Visit
Admission: RE | Admit: 2013-09-05 | Discharge: 2013-09-05 | Disposition: A | Discharge status: Home / Self Care | Payer: 59 | Source: Ambulatory Visit

## 2013-09-05 DIAGNOSIS — Z1231 Encounter for screening mammogram for malignant neoplasm of breast: Secondary | ICD-10-CM

## 2013-09-07 ENCOUNTER — Other Ambulatory Visit: Payer: Self-pay | Admitting: Obstetrics and Gynecology

## 2013-09-07 DIAGNOSIS — R928 Other abnormal and inconclusive findings on diagnostic imaging of breast: Secondary | ICD-10-CM

## 2013-09-19 ENCOUNTER — Ambulatory Visit
Admission: RE | Admit: 2013-09-19 | Discharge: 2013-09-19 | Disposition: A | Discharge status: Home / Self Care | Payer: 59 | Source: Ambulatory Visit | Attending: Obstetrics and Gynecology | Admitting: Obstetrics and Gynecology

## 2013-09-19 ENCOUNTER — Ambulatory Visit
Admission: RE | Admit: 2013-09-19 | Discharge: 2013-09-19 | Disposition: A | Discharge status: Home / Self Care | Payer: Self-pay | Source: Ambulatory Visit | Attending: Obstetrics and Gynecology | Admitting: Obstetrics and Gynecology

## 2013-09-19 DIAGNOSIS — R928 Other abnormal and inconclusive findings on diagnostic imaging of breast: Secondary | ICD-10-CM

## 2013-09-20 ENCOUNTER — Other Ambulatory Visit: Payer: Self-pay | Admitting: Obstetrics and Gynecology

## 2013-09-20 DIAGNOSIS — N6320 Unspecified lump in the left breast, unspecified quadrant: Secondary | ICD-10-CM

## 2013-11-27 ENCOUNTER — Other Ambulatory Visit: Payer: Self-pay

## 2013-12-24 DIAGNOSIS — R748 Abnormal levels of other serum enzymes: Secondary | ICD-10-CM | POA: Insufficient documentation

## 2013-12-24 DIAGNOSIS — D649 Anemia, unspecified: Secondary | ICD-10-CM | POA: Insufficient documentation

## 2013-12-27 DIAGNOSIS — M5136 Other intervertebral disc degeneration, lumbar region: Secondary | ICD-10-CM | POA: Insufficient documentation

## 2013-12-27 DIAGNOSIS — M5416 Radiculopathy, lumbar region: Secondary | ICD-10-CM | POA: Insufficient documentation

## 2014-01-29 DIAGNOSIS — M7061 Trochanteric bursitis, right hip: Secondary | ICD-10-CM | POA: Insufficient documentation

## 2014-03-20 ENCOUNTER — Other Ambulatory Visit: Payer: PRIVATE HEALTH INSURANCE

## 2014-03-29 ENCOUNTER — Other Ambulatory Visit: Payer: PRIVATE HEALTH INSURANCE

## 2014-04-26 ENCOUNTER — Other Ambulatory Visit: Payer: PRIVATE HEALTH INSURANCE

## 2014-05-01 ENCOUNTER — Ambulatory Visit
Admission: RE | Admit: 2014-05-01 | Discharge: 2014-05-01 | Disposition: A | Discharge status: Home / Self Care | Payer: 59 | Source: Ambulatory Visit | Attending: Obstetrics and Gynecology | Admitting: Obstetrics and Gynecology

## 2014-05-01 DIAGNOSIS — N6320 Unspecified lump in the left breast, unspecified quadrant: Secondary | ICD-10-CM

## 2014-10-16 ENCOUNTER — Other Ambulatory Visit: Payer: Self-pay | Admitting: Obstetrics and Gynecology

## 2014-10-16 DIAGNOSIS — N6002 Solitary cyst of left breast: Secondary | ICD-10-CM

## 2014-11-22 ENCOUNTER — Ambulatory Visit
Admission: RE | Admit: 2014-11-22 | Discharge: 2014-11-22 | Disposition: A | Discharge status: Home / Self Care | Payer: 59 | Source: Ambulatory Visit | Attending: Obstetrics and Gynecology | Admitting: Obstetrics and Gynecology

## 2014-11-22 DIAGNOSIS — N6002 Solitary cyst of left breast: Secondary | ICD-10-CM

## 2015-10-24 ENCOUNTER — Other Ambulatory Visit: Payer: Self-pay | Admitting: Obstetrics and Gynecology

## 2015-10-24 DIAGNOSIS — N6002 Solitary cyst of left breast: Secondary | ICD-10-CM

## 2015-11-26 ENCOUNTER — Other Ambulatory Visit: Payer: 59

## 2016-02-19 ENCOUNTER — Ambulatory Visit
Admission: RE | Admit: 2016-02-19 | Discharge: 2016-02-19 | Disposition: A | Discharge status: Home / Self Care | Payer: 59 | Source: Ambulatory Visit | Attending: Obstetrics and Gynecology | Admitting: Obstetrics and Gynecology

## 2016-02-19 DIAGNOSIS — N6002 Solitary cyst of left breast: Secondary | ICD-10-CM

## 2017-04-08 ENCOUNTER — Other Ambulatory Visit: Payer: Self-pay | Admitting: Obstetrics and Gynecology

## 2017-04-08 DIAGNOSIS — Z1231 Encounter for screening mammogram for malignant neoplasm of breast: Secondary | ICD-10-CM

## 2017-05-10 ENCOUNTER — Ambulatory Visit
Admission: RE | Admit: 2017-05-10 | Discharge: 2017-05-10 | Disposition: A | Discharge status: Home / Self Care | Payer: 59 | Source: Ambulatory Visit | Attending: Obstetrics and Gynecology | Admitting: Obstetrics and Gynecology

## 2017-05-10 DIAGNOSIS — Z1231 Encounter for screening mammogram for malignant neoplasm of breast: Secondary | ICD-10-CM

## 2017-10-18 ENCOUNTER — Other Ambulatory Visit
Admission: RE | Admit: 2017-10-18 | Discharge: 2017-10-18 | Disposition: A | Discharge status: Home / Self Care | Payer: 59 | Source: Ambulatory Visit | Attending: Nurse Practitioner | Admitting: Nurse Practitioner

## 2017-10-18 DIAGNOSIS — K5010 Crohn's disease of large intestine without complications: Secondary | ICD-10-CM | POA: Insufficient documentation

## 2017-10-21 LAB — MISCELLANEOUS TEST

## 2019-11-07 ENCOUNTER — Ambulatory Visit (INDEPENDENT_AMBULATORY_CARE_PROVIDER_SITE_OTHER): Payer: 59 | Admitting: Dermatology

## 2019-11-07 ENCOUNTER — Encounter: Payer: Self-pay | Admitting: Dermatology

## 2019-11-07 ENCOUNTER — Other Ambulatory Visit: Payer: Self-pay

## 2019-11-07 DIAGNOSIS — L821 Other seborrheic keratosis: Secondary | ICD-10-CM | POA: Diagnosis not present

## 2019-11-07 DIAGNOSIS — L811 Chloasma: Secondary | ICD-10-CM

## 2019-11-07 DIAGNOSIS — L820 Inflamed seborrheic keratosis: Secondary | ICD-10-CM | POA: Diagnosis not present

## 2019-11-07 NOTE — Progress Notes (Signed)
   Follow-Up Visit   Subjective  [name] is a 53 y.o. female who presents for the following: Seborrheic Keratosis (left face, destruction with LN2 or with curette or with another Halo tx), Botulinum Toxin Injection (Discussed b/l brow lift and frown complex), and Under eye circles (Discussed can use Alastin, the Perfect "A").  She had botox done with credits at a spa so defers treatment today.  The following portions of the chart were reviewed this encounter and updated as appropriate: Allergies  Meds  Problems  Med Hx  Surg Hx  Fam Hx      Review of Systems: No other skin or systemic complaints.  Objective  Well appearing patient in no apparent distress; mood and affect are within normal limits.  A focused examination was performed including face. Relevant physical exam findings are noted in the Assessment and Plan.  Objective  Left Forearm - Posterior, sub mandibular: Erythematous keratotic or waxy stuck-on papule or plaque.   Objective  Face: Hyperpigmented patches   Objective  Left Buccal Cheek : Macular seborrheic keratosis and lentigines  Assessment & Plan  Inflamed seborrheic keratosis (2) Left Forearm - Posterior; sub mandibular  Destruction of lesion - Left Forearm - Posterior, sub mandibular  Destruction method: cryotherapy   Informed consent: discussed and consent obtained   Lesion destroyed using liquid nitrogen: Yes   Outcome: patient tolerated procedure well with no complications   Post-procedure details: wound Caroline instructions given    Melasma Face  Chronic, currently doing well  Will D/C tranexamic acid until after Halo, then restart. Continue using 50+ SPF mineral tinted sunscreen daily.   D/C the perfect "A" (tretinoin with vitamin C) until 2 weeks after Halo tx.   Restart hydroquinone,kojic acid,vit c cream in Mid May or if Halo is in May at least 2 weeks after tx.   Recommend use of Heliocare daily for additional sun  protection  Seborrheic keratosis Left Buccal Cheek    plan halo consider cryotherapy after halo. Patient defer the Perfect dermapeel. Patient to use Nectar 2 to 6 weeks prior to Halo tx. 1 pump BID  Return for Halo to be scheduled soon, plan end of July or August for melasma follow-up.   IDonzetta Kohut, CMA, am acting as scribe for Forest Gleason, MD .  Documentation: I have reviewed the above documentation for accuracy and completeness, and I agree with the above.  Forest Gleason, MD

## 2019-11-07 NOTE — Patient Instructions (Addendum)
Recommend Heliocare  Liquid nitrogen was applied for 10-12 seconds to the skin lesion and the expected blistering or scabbing reaction explained. Do not pick at the area. Patient reminded to expect hypopigmented scars from the procedure. Return if lesion fails to fully resolve.

## 2019-11-13 ENCOUNTER — Encounter: Payer: Self-pay | Admitting: Dermatology

## 2019-12-11 ENCOUNTER — Ambulatory Visit: Payer: 59

## 2019-12-18 ENCOUNTER — Ambulatory Visit: Payer: 59

## 2020-03-13 ENCOUNTER — Ambulatory Visit: Payer: 59 | Admitting: Dermatology

## 2020-04-17 ENCOUNTER — Other Ambulatory Visit: Payer: Self-pay | Admitting: Obstetrics and Gynecology

## 2020-04-17 DIAGNOSIS — Z1231 Encounter for screening mammogram for malignant neoplasm of breast: Secondary | ICD-10-CM

## 2020-05-07 ENCOUNTER — Ambulatory Visit
Admission: RE | Admit: 2020-05-07 | Discharge: 2020-05-07 | Disposition: A | Discharge status: Home / Self Care | Payer: 59 | Source: Ambulatory Visit | Attending: Obstetrics and Gynecology | Admitting: Obstetrics and Gynecology

## 2020-05-07 ENCOUNTER — Other Ambulatory Visit: Payer: Self-pay

## 2020-05-07 DIAGNOSIS — Z1231 Encounter for screening mammogram for malignant neoplasm of breast: Secondary | ICD-10-CM

## 2020-05-09 ENCOUNTER — Other Ambulatory Visit: Payer: Self-pay | Admitting: Obstetrics and Gynecology

## 2020-05-09 DIAGNOSIS — R928 Other abnormal and inconclusive findings on diagnostic imaging of breast: Secondary | ICD-10-CM

## 2020-05-23 ENCOUNTER — Ambulatory Visit
Admission: RE | Admit: 2020-05-23 | Discharge: 2020-05-23 | Disposition: A | Discharge status: Home / Self Care | Payer: 59 | Source: Ambulatory Visit | Attending: Obstetrics and Gynecology | Admitting: Obstetrics and Gynecology

## 2020-05-23 ENCOUNTER — Other Ambulatory Visit: Payer: Self-pay

## 2020-05-23 ENCOUNTER — Other Ambulatory Visit: Payer: 59

## 2020-05-23 ENCOUNTER — Other Ambulatory Visit: Payer: Self-pay | Admitting: Obstetrics and Gynecology

## 2020-05-23 DIAGNOSIS — R928 Other abnormal and inconclusive findings on diagnostic imaging of breast: Secondary | ICD-10-CM

## 2020-05-23 DIAGNOSIS — N6489 Other specified disorders of breast: Secondary | ICD-10-CM

## 2020-06-04 ENCOUNTER — Ambulatory Visit
Admission: RE | Admit: 2020-06-04 | Discharge: 2020-06-04 | Disposition: A | Discharge status: Home / Self Care | Payer: 59 | Source: Ambulatory Visit | Attending: Obstetrics and Gynecology | Admitting: Obstetrics and Gynecology

## 2020-06-04 ENCOUNTER — Other Ambulatory Visit: Payer: Self-pay

## 2020-06-04 DIAGNOSIS — N6489 Other specified disorders of breast: Secondary | ICD-10-CM

## 2020-06-10 ENCOUNTER — Other Ambulatory Visit: Payer: Self-pay

## 2020-06-12 ENCOUNTER — Ambulatory Visit (INDEPENDENT_AMBULATORY_CARE_PROVIDER_SITE_OTHER): Payer: 59 | Admitting: Nurse Practitioner

## 2020-06-12 ENCOUNTER — Encounter: Payer: Self-pay | Admitting: Nurse Practitioner

## 2020-06-12 ENCOUNTER — Other Ambulatory Visit: Payer: Self-pay

## 2020-06-12 VITALS — BP 102/74 | HR 58 | Temp 98.1°F | Ht 64.0 in | Wt 167.0 lb

## 2020-06-12 DIAGNOSIS — K5010 Crohn's disease of large intestine without complications: Secondary | ICD-10-CM | POA: Diagnosis not present

## 2020-06-12 DIAGNOSIS — K5090 Crohn's disease, unspecified, without complications: Secondary | ICD-10-CM | POA: Insufficient documentation

## 2020-06-12 DIAGNOSIS — Z23 Encounter for immunization: Secondary | ICD-10-CM | POA: Diagnosis not present

## 2020-06-12 DIAGNOSIS — Z6828 Body mass index (BMI) 28.0-28.9, adult: Secondary | ICD-10-CM | POA: Diagnosis not present

## 2020-06-12 DIAGNOSIS — Z0000 Encounter for general adult medical examination without abnormal findings: Secondary | ICD-10-CM | POA: Insufficient documentation

## 2020-06-12 DIAGNOSIS — D509 Iron deficiency anemia, unspecified: Secondary | ICD-10-CM | POA: Diagnosis not present

## 2020-06-12 DIAGNOSIS — E663 Overweight: Secondary | ICD-10-CM

## 2020-06-12 LAB — LIPID PANEL
Cholesterol: 147 mg/dL (ref 0–200)
HDL: 53.7 mg/dL (ref >39.00)
LDL Cholesterol: 75 mg/dL (ref 0–99)
NonHDL: 92.85
Total CHOL/HDL Ratio: 3
Triglycerides: 88 mg/dL (ref 0.0–149.0)
VLDL: 17.6 mg/dL (ref 0.0–40.0)

## 2020-06-12 LAB — COMPREHENSIVE METABOLIC PANEL
ALT: 8 U/L (ref 0–35)
AST: 13 U/L (ref 0–37)
Albumin: 4 g/dL (ref 3.5–5.2)
Alkaline Phosphatase: 69 U/L (ref 39–117)
BUN: 10 mg/dL (ref 6–23)
CO2: 28 mEq/L (ref 19–32)
Calcium: 9.3 mg/dL (ref 8.4–10.5)
Chloride: 104 mEq/L (ref 96–112)
Creatinine, Ser: 0.67 mg/dL (ref 0.40–1.20)
GFR: 99.3 mL/min (ref >60.00)
Glucose, Bld: 89 mg/dL (ref 70–99)
Potassium: 4.6 mEq/L (ref 3.5–5.1)
Sodium: 137 mEq/L (ref 135–145)
Total Bilirubin: 0.3 mg/dL (ref 0.2–1.2)
Total Protein: 6.4 g/dL (ref 6.0–8.3)

## 2020-06-12 LAB — IBC PANEL
Iron: 26 ug/dL — ABNORMAL LOW (ref 42–145)
Saturation Ratios: 6.3 % — ABNORMAL LOW (ref 20.0–50.0)
Transferrin: 293 mg/dL (ref 212.0–360.0)

## 2020-06-12 LAB — C-REACTIVE PROTEIN: CRP: 1 mg/dL (ref 0.5–20.0)

## 2020-06-12 LAB — CBC WITH DIFFERENTIAL/PLATELET
Basophils Absolute: 0.1 10*3/uL (ref 0.0–0.1)
Basophils Relative: 0.6 % (ref 0.0–3.0)
Eosinophils Absolute: 0.2 10*3/uL (ref 0.0–0.7)
Eosinophils Relative: 2.2 % (ref 0.0–5.0)
HCT: 35.4 % — ABNORMAL LOW (ref 36.0–46.0)
Hemoglobin: 11.3 g/dL — ABNORMAL LOW (ref 12.0–15.0)
Lymphocytes Relative: 11.1 % — ABNORMAL LOW (ref 12.0–46.0)
Lymphs Abs: 1 10*3/uL (ref 0.7–4.0)
MCHC: 32 g/dL (ref 30.0–36.0)
MCV: 85.4 fl (ref 78.0–100.0)
Monocytes Absolute: 0.8 10*3/uL (ref 0.1–1.0)
Monocytes Relative: 9 % (ref 3.0–12.0)
Neutro Abs: 7.2 10*3/uL (ref 1.4–7.7)
Neutrophils Relative %: 77.1 % — ABNORMAL HIGH (ref 43.0–77.0)
Platelets: 332 10*3/uL (ref 150.0–400.0)
RBC: 4.15 Mil/uL (ref 3.87–5.11)
RDW: 16.5 % — ABNORMAL HIGH (ref 11.5–15.5)
WBC: 9.4 10*3/uL (ref 4.0–10.5)

## 2020-06-12 LAB — TSH: TSH: 1.55 u[IU]/mL (ref 0.35–4.50)

## 2020-06-12 LAB — B12 AND FOLATE PANEL
Folate: 9.8 ng/mL (ref >5.9)
Vitamin B-12: 459 pg/mL (ref 211–911)

## 2020-06-12 LAB — HEMOGLOBIN A1C: Hgb A1c MFr Bld: 5.8 % (ref 4.6–6.5)

## 2020-06-12 LAB — VITAMIN D 25 HYDROXY (VIT D DEFICIENCY, FRACTURES): VITD: 16.73 ng/mL — ABNORMAL LOW (ref 30.00–100.00)

## 2020-06-12 LAB — SEDIMENTATION RATE: Sed Rate: 41 mm/hr — ABNORMAL HIGH (ref 0–30)

## 2020-06-12 NOTE — Patient Instructions (Addendum)
Please go to the lab today for routine physical exam labs.  I have placed referral into [facility] with Dr. Vicente Males  Continue to take your current Crohn's medication.  You may increase your balsalazide as you have been instructed to help manage mild diarrhea.  Continue to follow-up with breast surgeon as you have planned.  Further directions regarding iron deficiency anemia pending lab results.  If you are still low, I would recommend Vitron C and this is sold over-the-counter.  It seems to be better tolerated to the GI system.  Some people can do a tablet every other day and stay on that long-term and still get her iron stores elevated.  Routine health is every 11-monthdental evaluation, annual eye exam, sunscreen when outdoors, seatbelt use every time you are in the car, healthy diet and exercise.  Goal weight of  BMI of 25.  Immunizations: If you cannot find your last tetanus, I do recommend a Tdap vaccine.  Today we can give the PCV13 vaccine.  You are also due for shingles vaccine which is a 2 shot series when able.  When the during the Covid booster is available I would recommend getting that as well after you have completed the breast surgery.  Follow-up office visit in 3 months for recheck.  Call sooner if you are having problems.     Preventive Care [id number] Old, Female Preventive care refers to visits with your health care provider and lifestyle choices that can promote health and wellness. This includes:  A yearly physical exam. This may also be called an annual well check.  Regular dental visits and eye exams.  Immunizations.  Screening for certain conditions.  Healthy lifestyle choices, such as eating a healthy diet, getting regular exercise, not using drugs or products that contain nicotine and tobacco, and limiting alcohol use. What can I expect for my preventive care visit? Physical exam Your health care provider will check your:  Height and weight. This may be used  to calculate body mass index (BMI), which tells if you are at a healthy weight.  Heart rate and blood pressure.  Skin for abnormal spots. Counseling Your health care provider may ask you questions about your:  Alcohol, tobacco, and drug use.  Emotional well-being.  Home and relationship well-being.  Sexual activity.  Eating habits.  Work and work eStatistician  Method of birth control.  Menstrual cycle.  Pregnancy history. What immunizations do I need?  Influenza (flu) vaccine  This is recommended every year. Tetanus, diphtheria, and pertussis (Tdap) vaccine  You may need a Td booster every 10 years. Varicella (chickenpox) vaccine  You may need this if you have not been vaccinated. Zoster (shingles) vaccine  You may need this after age 54 Measles, mumps, and rubella (MMR) vaccine  You may need at least one dose of MMR if you were born in 1957 or later. You may also need a second dose. Pneumococcal conjugate (PCV13) vaccine  You may need this if you have certain conditions and were not previously vaccinated. Pneumococcal polysaccharide (PPSV23) vaccine  You may need one or two doses if you smoke cigarettes or if you have certain conditions. Meningococcal conjugate (MenACWY) vaccine  You may need this if you have certain conditions. Hepatitis A vaccine  You may need this if you have certain conditions or if you travel or work in places where you may be exposed to hepatitis A. Hepatitis B vaccine  You may need this if you have certain conditions  or if you travel or work in places where you may be exposed to hepatitis B. Haemophilus influenzae type b (Hib) vaccine  You may need this if you have certain conditions. Human papillomavirus (HPV) vaccine  If recommended by your health care provider, you may need three doses over 6 months. You may receive vaccines as individual doses or as more than one vaccine together in one shot (combination vaccines). Talk with  your health care provider about the risks and benefits of combination vaccines. What tests do I need? Blood tests  Lipid and cholesterol levels. These may be checked every 5 years, or more frequently if you are 54 years old.  Hepatitis C test.  Hepatitis B test. Screening  Lung cancer screening. You may have this screening every year starting at age 11 if you have a 30-pack-year history of smoking and currently smoke or have quit within the past 15 years.  Colorectal cancer screening. All adults should have this screening starting at age 54 and continuing until age 74. Your health care provider may recommend screening at age 49 if you are at increased risk. You will have tests every 1-10 years, depending on your results and the type of screening test.  Diabetes screening. This is done by checking your blood sugar (glucose) after you have not eaten for a while (fasting). You may have this done every 1-3 years.  Mammogram. This may be done every 1-2 years. Talk with your health care provider about when you should start having regular mammograms. This may depend on whether you have a family history of breast cancer.  BRCA-related cancer screening. This may be done if you have a family history of breast, ovarian, tubal, or peritoneal cancers.  Pelvic exam and Pap test. This may be done every 3 years starting at age 54. Starting at age 54, this may be done every 5 years if you have a Pap test in combination with an HPV test. Other tests  Sexually transmitted disease (STD) testing.  Bone density scan. This is done to screen for osteoporosis. You may have this scan if you are at high risk for osteoporosis. Follow these instructions at home: Eating and drinking  Eat a diet that includes fresh fruits and vegetables, whole grains, lean protein, and low-fat dairy.  Take vitamin and mineral supplements as recommended by your health care provider.  Do not drink alcohol if: ? Your health  care provider tells you not to drink. ? You are pregnant, may be pregnant, or are planning to become pregnant.  If you drink alcohol: ? Limit how much you have to 0-1 drink a day. ? Be aware of how much alcohol is in your drink. In the [place] one drink equals one 12 oz bottle of beer (355 mL), one 5 oz glass of wine (148 mL), or one 1 oz glass of hard liquor (44 mL). Lifestyle  Take daily care of your teeth and gums.  Stay active. Exercise for at least 30 minutes on 5 or more days each week.  Do not use any products that contain nicotine or tobacco, such as cigarettes, e-cigarettes, and chewing tobacco. If you need help quitting, ask your health care provider.  If you are sexually active, practice safe sex. Use a condom or other form of birth control (contraception) in order to prevent pregnancy and STIs (sexually transmitted infections).  If told by your health care provider, take low-dose aspirin daily starting at age 36. What's next?  Visit your health  care provider once a year for a well check visit.  Ask your health care provider how often you should have your eyes and teeth checked.  Stay up to date on all vaccines. This information is not intended to replace advice given to you by your health care provider. Make sure you discuss any questions you have with your health care provider. Document Revised: 04/06/2018 Document Reviewed: 04/06/2018 Elsevier Patient Education  2020 [employer].

## 2020-06-12 NOTE — Progress Notes (Addendum)
Established Patient Office Visit  Subjective:  Patient ID: [name], female    DOB: [birth year]  Age: 54 y.o. MRN: [id number]  CC:  Chief Complaint  Patient presents with  . New Patient (Initial Visit)    establish care    HPI [name] is a 53 year old patient who presents to establish care with primary care.  She has a history of Crohn's disease would like referral to a new GI provider.   She had her Covid vaccine in April and in May she had a tick bite . She had no fever/HA/rash or any illness associated with the tick bite. In the following weeks, she had minor constipation followed by diarrhea. Since June, she has been passing 2-3  loose stools per day. No blood or melena. No abdominal pain. She just increased her Balsalazide 750 mg  from 3 pills per day (2.25 gm)  to 4 pills per day (3.0gm). She remains on 25 mg of 6-MP.  She is not taking oxycodone, Asacol, or tramadol. She was last seen by her GI provider 12/20/2019.  Her most recent colonoscopy was performed 05/18/2013 showing 3 mm colonic mucosa polyp in sigmoid colon.  Small internal hemorrhoids.  She is overdue for her surveillance colonoscopy. 12/20/2019: TPMT 299. ESR 23, CRP 4.   IDA: 12/2019: Hgb 12, MCV 94,  Iron sat 11%, ferritin 9 . She is not on iron  supplementation.  B12: 451, Vit D:  35   Spinal stenosis: On Celebrex 200 mg daily since 2019 and gabapentin 300 mg once a  daily. She reports a need for the Celebrex and would like to come off gabapentin in the future.    BMI 28/overweight: working on wt loss. DTR getting married next spring Wt Readings from Last 3 Encounters:  06/12/20 167 lb (75.8 kg)  11/17/12 164 lb (74.4 kg)  11/13/12 164 lb (74.4 kg)   Immunizations: Needs PNA, Shingrix, Tdap. Covid Moderna done 10/17/19 and 4/7 2021. Flu 05/23/20 Diet: regular Exercise: no regular Dexa: no Pap Smear: Hysterectomy Mammogram:Breast Bx last week -non cancerous and plan to remove the lesion to see Dr.Toth  on  Dec 2 . Dentist: UTD. Rec every 6 mos Vision: Rec annually Tobacco: none ETOH: rare   Past Medical History:  Diagnosis Date  . Crohn's disease ([place])   . Polyp of colon     Past Surgical History:  Procedure Laterality Date  . ABDOMINAL HYSTERECTOMY    . ANTERIOR AND POSTERIOR REPAIR N/A 11/17/2012   Procedure: ANTERIOR (CYSTOCELE) AND POSTERIOR (RECTOCELE), Enterocele repair.;  Surgeon: Lovenia Kim, MD;  Location: [facility];  Service: Gynecology;  Laterality: N/A;  Also Enterocele repair.  Marland Kitchen BILATERAL SALPINGECTOMY Bilateral 11/17/2012   Procedure: BILATERAL SALPINGECTOMY;  Surgeon: Lovenia Kim, MD;  Location: [facility];  Service: Gynecology;  Laterality: Bilateral;  . BLADDER SUSPENSION N/A 11/17/2012   Procedure: TRANSVAGINAL TAPE (TVT) PROCEDURE Sling with Cystoscopy.;  Surgeon: Lovenia Kim, MD;  Location: [facility];  Service: Gynecology;  Laterality: N/A;  TVT Sling with Cystoscopy.  Marland Kitchen BREAST SURGERY    . MANDIBLE SURGERY    . ROBOTIC ASSISTED TOTAL HYSTERECTOMY N/A 11/17/2012   Procedure: ROBOTIC ASSISTED TOTAL HYSTERECTOMY;  Surgeon: Lovenia Kim, MD;  Location: [facility];  Service: Gynecology;  Laterality: N/A;  Requests 3 1/2 hrs.    Family History  Problem Relation Age of Onset  . Arthritis Mother   . Cancer Mother   . COPD Mother   .  Hearing loss Mother   . Alcohol abuse Father   . Early death Father   . Arthritis Brother   . Diabetes Maternal Grandmother   . Heart disease Maternal Grandmother   . COPD Maternal Grandfather     Social History   Socioeconomic History  . Marital status: Married    Spouse name: Not on file  . Number of children: Not on file  . Years of education: Not on file  . Highest education level: Some college, no degree  Occupational History  . Not on file  Tobacco Use  . Smoking status: Never Smoker  . Smokeless tobacco: Never Used  Vaping Use  . Vaping Use: Never used  Substance and Sexual Activity  . Alcohol use: No  .  Drug use: No  . Sexual activity: Not Currently  Other Topics Concern  . Not on file  Social History Narrative  . Not on file   Social Determinants of Health   Financial Resource Strain:   . Difficulty of Paying Living Expenses: Not on file  Food Insecurity:   . Worried About Charity fundraiser in the Last Year: Not on file  . Ran Out of Food in the Last Year: Not on file  Transportation Needs:   . Lack of Transportation (Medical): Not on file  . Lack of Transportation (Non-Medical): Not on file  Physical Activity:   . Days of Exercise per Week: Not on file  . Minutes of Exercise per Session: Not on file  Stress:   . Feeling of Stress : Not on file  Social Connections:   . Frequency of Communication with Friends and Family: Not on file  . Frequency of Social Gatherings with Friends and Family: Not on file  . Attends Religious Services: Not on file  . Active Member of Clubs or Organizations: Not on file  . Attends Archivist Meetings: Not on file  . Marital Status: Not on file  Intimate Partner Violence:   . Fear of Current or Ex-Partner: Not on file  . Emotionally Abused: Not on file  . Physically Abused: Not on file  . Sexually Abused: Not on file    Outpatient Medications Prior to Visit  Medication Sig Dispense Refill  . balsalazide (COLAZAL) 750 MG capsule TAKE 3 CAPSULES (2,250 MG TOTAL) BY MOUTH 3 (THREE) TIMES DAILY.    Marland Kitchen CALCIUM-VITAMIN D PO Take 1 tablet by mouth daily.    . celecoxib (CELEBREX) 200 MG capsule TAKE 1 CAPSULE ONCE A DAY AS NEEDED    . gabapentin (NEURONTIN) 300 MG capsule TAKE 1 CAPSULE BY MOUTH TWICE A DAY    . mercaptopurine (PURINETHOL) 50 MG tablet Take 50 mg by mouth daily. Give on an empty stomach 1 hour before or 2 hours after meals. Caution: Chemotherapy.    . Multiple Vitamin (MULTI-VITAMIN) tablet Take by mouth.    . Mesalamine (ASACOL HD) 800 MG TBEC Take 2,400 mg by mouth daily. Pt. Reports that she takes three 86m capsule  every morning.    .Marland KitchenoxyCODONE-acetaminophen (PERCOCET/ROXICET) 5-325 MG per tablet Take 1-2 tablets by mouth every 4 (four) hours as needed. 40 tablet 0  . traMADol (ULTRAM) 50 MG tablet Take 1-2 tablets (50-100 mg total) by mouth every 6 (six) hours as needed. 30 tablet 0   No facility-administered medications prior to visit.    Allergies  Allergen Reactions  . Sulfa Antibiotics Anaphylaxis and Swelling    Review of Systems  Constitutional: Negative for chills,  fever and unexpected weight change.  HENT: Negative.   Eyes: Negative.   Respiratory: Negative.   Cardiovascular: Negative.   Gastrointestinal: Positive for diarrhea. Negative for abdominal pain and blood in stool.  Genitourinary: Negative.   Musculoskeletal: Positive for back pain.  Skin: Negative.   Allergic/Immunologic: Negative.   Neurological: Negative.   Hematological: Negative.   Psychiatric/Behavioral: Negative.       Objective:    Physical Exam Vitals reviewed.  Constitutional:      Appearance: Normal appearance.  HENT:     Head: Normocephalic and atraumatic.     Right Ear: Tympanic membrane normal.     Left Ear: Tympanic membrane normal.     Mouth/Throat:     Mouth: Mucous membranes are moist.     Pharynx: Oropharynx is clear.  Eyes:     Pupils: Pupils are equal, round, and reactive to light.  Cardiovascular:     Rate and Rhythm: Normal rate and regular rhythm.     Pulses: Normal pulses.     Heart sounds: Normal heart sounds.  Pulmonary:     Effort: Pulmonary effort is normal.     Breath sounds: Normal breath sounds.  Abdominal:     General: Abdomen is flat.     Palpations: Abdomen is soft.     Tenderness: There is no abdominal tenderness.  Musculoskeletal:        General: Normal range of motion.     Cervical back: Normal range of motion and neck supple.  Skin:    General: Skin is warm and dry.  Neurological:     General: No focal deficit present.     Mental Status: She is alert and  oriented to person, place, and time.  Psychiatric:        Mood and Affect: Mood normal.        Behavior: Behavior normal.     BP 102/74 (BP Location: Left Arm, Patient Position: Sitting, Cuff Size: Normal)   Pulse (!) 58   Temp 98.1 F (36.7 C) (Oral)   Ht _0  (1.626 m)   Wt 167 lb (75.8 kg)   SpO2 99%   BMI 28.67 kg/m  Wt Readings from Last 3 Encounters:  06/12/20 167 lb (75.8 kg)  11/17/12 164 lb (74.4 kg)  11/13/12 164 lb (74.4 kg)   Pulse Readings from Last 3 Encounters:  06/12/20 (!) 58  11/18/12 81    BP Readings from Last 3 Encounters:  06/12/20 102/74  11/18/12 104/73  11/13/12 115/77    Lab Results  Component Value Date   CHOL 147 06/12/2020   HDL 53.70 06/12/2020   LDLCALC 75 06/12/2020   TRIG 88.0 06/12/2020   CHOLHDL 3 06/12/2020      Health Maintenance Due  Topic Date Due  . Hepatitis C Screening  Never done  . TETANUS/TDAP  Never done  . PAP SMEAR-Modifier  Never done  . COLONOSCOPY  Never done    There are no preventive care reminders to display for this patient.  Lab Results  Component Value Date   TSH 1.55 06/12/2020   Lab Results  Component Value Date   WBC 9.4 06/12/2020   HGB 11.3 (L) 06/12/2020   HCT 35.4 (L) 06/12/2020   MCV 85.4 06/12/2020   PLT 332.0 06/12/2020   Lab Results  Component Value Date   NA 137 06/12/2020   K 4.6 06/12/2020   CO2 28 06/12/2020   GLUCOSE 89 06/12/2020   BUN 10 06/12/2020   CREATININE  0.67 06/12/2020   BILITOT 0.3 06/12/2020   ALKPHOS 69 06/12/2020   AST 13 06/12/2020   ALT 8 06/12/2020   PROT 6.4 06/12/2020   ALBUMIN 4.0 06/12/2020   CALCIUM 9.3 06/12/2020   GFR 99.30 06/12/2020   Lab Results  Component Value Date   CHOL 147 06/12/2020   Lab Results  Component Value Date   HDL 53.70 06/12/2020   Lab Results  Component Value Date   LDLCALC 75 06/12/2020   Lab Results  Component Value Date   TRIG 88.0 06/12/2020   Lab Results  Component Value Date   CHOLHDL 3 06/12/2020    Lab Results  Component Value Date   HGBA1C 5.8 06/12/2020      Assessment & Plan:   Problem List Items Addressed This Visit      Digestive   Crohn's disease of large intestine without complication ([place]) - Primary   Relevant Orders   CBC with Differential/Platelet (Completed)   Comprehensive metabolic panel (Completed)   VITAMIN D 25 Hydroxy (Vit-D Deficiency, Fractures) (Completed)   B12 and Folate Panel (Completed)   IBC panel (Completed)   QuantiFERON-TB Gold Plus   Acute Hep Panel & Hep B Surface Ab   Sedimentation rate (Completed)   C-reactive protein (Completed)   Ambulatory referral to Gastroenterology     Other   Encounter for medical examination to establish care   Relevant Orders   TSH (Completed)   Lipid panel (Completed)   Iron deficiency anemia   Relevant Orders   HIV Antibody (routine testing w rflx) (Completed)    Other Visit Diagnoses    Overweight with body mass index (BMI) of 28 to 28.9 in adult       Relevant Orders   Hemoglobin A1c (Completed)      No orders of the defined types were placed in this encounter.  Please go to the lab today for routine physical exam labs.  I have placed referral into [facility] with Dr. Vicente Males  Continue to take your current Crohn's medication.  You may increase your balsalazide as you have been instructed to help manage mild diarrhea.  Continue to follow-up with breast surgeon as you have planned.  Further directions regarding iron deficiency anemia pending lab results.  If you are still low, I would recommend Vitron C and this is sold over-the-counter.  It seems to be better tolerated to the GI system.  Some people can do a tablet every other day and stay on that long-term and still get her iron stores elevated.  Routine health is every 59-monthdental evaluation, annual eye exam, sunscreen when outdoors, seatbelt use every time you are in the car, healthy diet and exercise.  Goal weight of  BMI of  25.  Immunizations: If you cannot find your last tetanus, I do recommend a Tdap vaccine.  Today we can give the PCV13 vaccine.  You are also due for shingles vaccine which is a 2 shot series when able.  When the during the Covid booster is available I would recommend getting that as well after you have completed the breast surgery.  Lab Addendum: Labs show normal chemistry, kidney, liver, lipids, thyroid , B12/folate labs. Negative Hepatitis C and HIV screen.   Low Vit D: Begin 50, 000 IU once a week x 8 and then Vit D 3 1000 IU daily. Repeat Vit D level in 3 mos   IDA with very low Iron stores: Hgb 11.3, MCV, 85. Iron sat 6.3%, serum iron  26. Begin Vitron C one daily as it may be better tolerated than ferrous sulfate. Hematology consult for IV iron. Contact your current GI provider for scopes.   Pre DM: A1C; 5.8: Advise a diet rich in lean protein, nonfat low-fat dairy, vegetables, fruits, complex carbohydrates. Low glycemic index foods ( mail list). Aerobic exercise working toward goal of 30 min x 5 times a week and add in twice weekly weight resistance training.   ESR: Elevated ESR 41 up from baseline- Loose stool with Crohn's. Contact your current GI provider for Crohn's flare.   Follow-up office visit in 3 months for recheck.  Call sooner if you are having problems.     Follow-up: Return in about 3 months (around 09/12/2020).   This visit occurred during the SARS-CoV-2 public health emergency.  Safety protocols were in place, including screening questions prior to the visit, additional usage of staff PPE, and extensive cleaning of exam room while observing appropriate contact time as indicated for disinfecting solutions.    Denice Paradise, NP

## 2020-06-14 LAB — QUANTIFERON-TB GOLD PLUS
Mitogen-NIL: >10 IU/mL
NIL: 0.02 IU/mL
QuantiFERON-TB Gold Plus: NEGATIVE
TB1-NIL: 0 IU/mL
TB2-NIL: 0 IU/mL

## 2020-06-14 LAB — HIV ANTIBODY (ROUTINE TESTING W REFLEX): HIV 1&2 Ab, 4th Generation: NONREACTIVE

## 2020-06-16 ENCOUNTER — Encounter: Payer: Self-pay | Admitting: Nurse Practitioner

## 2020-06-30 ENCOUNTER — Ambulatory Visit (INDEPENDENT_AMBULATORY_CARE_PROVIDER_SITE_OTHER): Payer: 59 | Admitting: Gastroenterology

## 2020-06-30 ENCOUNTER — Other Ambulatory Visit: Payer: Self-pay

## 2020-06-30 ENCOUNTER — Encounter: Payer: Self-pay | Admitting: Gastroenterology

## 2020-06-30 VITALS — Ht 64.0 in | Wt 169.4 lb

## 2020-06-30 DIAGNOSIS — K5010 Crohn's disease of large intestine without complications: Secondary | ICD-10-CM | POA: Diagnosis not present

## 2020-06-30 DIAGNOSIS — Z13820 Encounter for screening for osteoporosis: Secondary | ICD-10-CM

## 2020-06-30 MED ORDER — NA SULFATE-K SULFATE-MG SULF 17.5-3.13-1.6 GM/177ML PO SOLN: 1.0000 | Freq: Once | ORAL | 0 refills | Status: AC

## 2020-06-30 NOTE — Progress Notes (Signed)
h  Jonathon Bellows MD, MRCP(U.K) [address]  [place]  [place], [place] [address]  Main: [phone]  Fax: [phone]   Gastroenterology Consultation  Referring Provider:     Marval Regal, NP Primary Care Physician:  Marval Regal, NP Primary Gastroenterologist:  Dr. Jonathon Bellows  Reason for Consultation:     Transfer of care for Crohn's disease        HPI:   [name] is a 54 y.o. y/o female referred for consultation & management  by  Marval Regal, NP.    She is here today to transfer of care from Dr. Ricky Stabs practice for Crohn's disease.  Last seen at their office in May 2021.  Last office note suggests the patient has had Crohn's disease of the right colon since 54 years of age.  Has been on mercaptopurine 25 mg and balsalazide.  Tried Remicade at some point but did not continue.  Based on the last office note colonoscopy was last performed in 2014.  I cannot see a report on epic.  Unclear of the extent of the Crohn's disease or endoscopic activity.  06/12/2020: CRP 1.0 hemoglobin 11.3 g with an MCV of 85 TSH of 1.55 CMP normal HIV negative vitamin D low at 16.7 B12 and folate normal.  Iron 26 TB QuantiFERON negative ESR of 41  No recent abdominal imaging. She states that she was diagnosed with Crohn's disease at the age of 65 and since then has been on mesalamine and 6-MP for many years.  Symptoms were doing pretty good till about a few months back when she started developing diarrhea alternating with constipation.  She increase the dose of her balsalazide and got a bit better but not where she would like it to be.  Denies any abdominal pain or cramping.  She takes Celebrex daily 1 tablet for many years.  Denies any smoking.  Denies any skin rashes or joint pains.  Last colonoscopy was over 7 years back.  She recollects that her inflammation was in the terminal ileum.  She has started taking oral iron a few weeks back, recently had a pneumococcal vaccination.   Previously has taken Remicade but stopped as they could not get IV access.  Has had a skin exam recently.    Past Medical History:  Diagnosis Date  . Crohn's disease ([place])   . Polyp of colon     Past Surgical History:  Procedure Laterality Date  . ABDOMINAL HYSTERECTOMY    . ANTERIOR AND POSTERIOR REPAIR N/A 11/17/2012   Procedure: ANTERIOR (CYSTOCELE) AND POSTERIOR (RECTOCELE), Enterocele repair.;  Surgeon: Lovenia Kim, MD;  Location: [facility];  Service: Gynecology;  Laterality: N/A;  Also Enterocele repair.  Marland Kitchen BILATERAL SALPINGECTOMY Bilateral 11/17/2012   Procedure: BILATERAL SALPINGECTOMY;  Surgeon: Lovenia Kim, MD;  Location: [facility];  Service: Gynecology;  Laterality: Bilateral;  . BLADDER SUSPENSION N/A 11/17/2012   Procedure: TRANSVAGINAL TAPE (TVT) PROCEDURE Sling with Cystoscopy.;  Surgeon: Lovenia Kim, MD;  Location: [facility];  Service: Gynecology;  Laterality: N/A;  TVT Sling with Cystoscopy.  Marland Kitchen BREAST SURGERY    . MANDIBLE SURGERY    . ROBOTIC ASSISTED TOTAL HYSTERECTOMY N/A 11/17/2012   Procedure: ROBOTIC ASSISTED TOTAL HYSTERECTOMY;  Surgeon: Lovenia Kim, MD;  Location: [facility];  Service: Gynecology;  Laterality: N/A;  Requests 3 1/2 hrs.    Prior to Admission medications   Medication Sig Start Date End Date Taking? Authorizing Provider  balsalazide Manning Charity)  750 MG capsule TAKE 3 CAPSULES (2,250 MG TOTAL) BY MOUTH 3 (THREE) TIMES DAILY. 01/10/19   [provider]  CALCIUM-VITAMIN D PO Take 1 tablet by mouth daily.    [provider]  celecoxib (CELEBREX) 200 MG capsule TAKE 1 CAPSULE ONCE A DAY AS NEEDED 05/10/18   [provider]  gabapentin (NEURONTIN) 300 MG capsule TAKE 1 CAPSULE BY MOUTH TWICE A DAY 05/10/18   [provider]  mercaptopurine (PURINETHOL) 50 MG tablet Take 50 mg by mouth daily. Give on an empty stomach 1 hour before or 2 hours after meals. Caution: Chemotherapy.    [provider]  Multiple Vitamin  (MULTI-VITAMIN) tablet Take by mouth.    [provider]    Family History  Problem Relation Age of Onset  . Arthritis Mother   . Cancer Mother   . COPD Mother   . Hearing loss Mother   . Alcohol abuse Father   . Early death Father   . Arthritis Brother   . Diabetes Maternal Grandmother   . Heart disease Maternal Grandmother   . COPD Maternal Grandfather      Social History   Tobacco Use  . Smoking status: Never Smoker  . Smokeless tobacco: Never Used  Vaping Use  . Vaping Use: Never used  Substance Use Topics  . Alcohol use: No  . Drug use: No    Allergies as of 06/30/2020 - Review Complete 06/12/2020  Allergen Reaction Noted  . Sulfa antibiotics Anaphylaxis and Swelling 11/11/2012    Review of Systems:    All systems reviewed and negative except where noted in HPI.   Physical Exam:  There were no vitals taken for this visit. No LMP recorded. Psych:  Alert and cooperative. Normal mood and affect. General:   Alert,  Well-developed, well-nourished, pleasant and cooperative in NAD Head:  Normocephalic and atraumatic. Eyes:  Sclera clear, no icterus.   Conjunctiva pink. Ears:  Normal auditory acuity. Lungs:  Respirations even and unlabored.  Clear throughout to auscultation.   No wheezes, crackles, or rhonchi. No acute distress. Heart:  Regular rate and rhythm; no murmurs, clicks, rubs, or gallops. Abdomen:  Normal bowel sounds.  No bruits.  Soft, non-tender and non-distended without masses, hepatosplenomegaly or hernias noted.  No guarding or rebound tenderness.    Neurologic:  Alert and oriented x3;  grossly normal neurologically. Psych:  Alert and cooperative. Normal mood and affect.  Imaging Studies: MM CLIP PLACEMENT LEFT  Result Date: 06/04/2020 CLINICAL DATA:  Status post ultrasound-guided core biopsy of the left breast. EXAM: 3D DIAGNOSTIC LEFT MAMMOGRAM POST ULTRASOUND BIOPSY COMPARISON:  Previous exam(s). FINDINGS: 3D Mammographic images were  obtained following ultrasound guided biopsy of the left breast. The biopsy marking clip is in expected location upper inner quadrant of the left breast. IMPRESSION: Appropriate positioning of the ribbon shaped biopsy marking clip at the site of biopsy in the upper-inner quadrant of the left breast. Final Assessment: Post Procedure Mammograms for Marker Placement Electronically Signed   By: Lillia Mountain M.D.   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 This was found to be concordant by Dr. Lillia Mountain, with excision recommended. Pathology results were discussed with the patient by telephone. The patient reported doing  well after the biopsy with tenderness at the site. Post biopsy instructions and care were reviewed and questions were answered. The patient was encouraged to call [facility] for any additional concerns. Per patient request, surgical consultation has been arranged with Dr. Autumn Messing at [facility] on July 10, 2020. Pathology results reported by Stacie Acres RN on 06/05/2020. Electronically Signed   By: Lillia Mountain M.D.   On: 06/05/2020 15:49   Result Date: 06/06/2020 CLINICAL DATA:  Architectural distortion in the upper inner quadrant of the left breast. EXAM: ULTRASOUND GUIDED LEFT BREAST CORE NEEDLE BIOPSY COMPARISON:  Previous exam(s). PROCEDURE: I met with the patient and we discussed the procedure of ultrasound-guided biopsy, including benefits and alternatives. We discussed the high likelihood of a successful procedure. We discussed the risks of the procedure, including infection, bleeding, tissue injury, clip migration, and inadequate sampling. Informed written consent was given. The usual  time-out protocol was performed immediately prior to the procedure. Lesion quadrant: Upper inner quadrant Using sterile technique and 1% lidocaine and 1% lidocaine with epinephrine as local anesthetic, under direct ultrasound visualization, a 12 gauge spring-loaded device was used to perform biopsy of distortion in the upper-inner quadrant of the left breast using a lateral to medial approach. At the conclusion of the procedure ribbon shaped tissue marker clip was deployed into the biopsy cavity. Follow up 2 view mammogram was performed and dictated separately. IMPRESSION: Ultrasound guided biopsy of the left breast. No apparent complications. Electronically Signed: By: Lillia Mountain M.D. On: 06/04/2020 09:07    Assessment and Plan:   JANESA DOCKERY is a 54 y.o. y/o female has been referred for transfer of care for Crohn's disease of the large intestine managed by Dr. Alice Reichert in the past.  It appears she has been on a mesalamine compound and a very low dose of 6-MP.  Unclear of endoscopic activity or radiological activity as no recent imaging available.  In addition to evaluating disease activity and appropriateness of present medication, she also requires updating on health maintenance issues which revolve around any patient with inflammatory bowel disease.  These patients are immunocompromised hence require appropriate vaccination, screening for osteoporosis, treatment of any anemias and screening for other cancers which are associated with these individuals.  Plan 1.  Colonoscopy to evaluate for endoscopic activity of the Crohn's disease in 6 weeks.  Based on the results we will decide if she is on appropriate therapy or not.  Generally balsalazide is a very mild therapy and used extremely occasionally for very minimal Crohn's activity of the colon.  She is on a very small dose of mercaptopurine not sure whether it is truly helping her or not.  2.  Strongly suggest to obtain  Covid 19 vaccination booster,  recombinant zoster vaccine.  I will check her for hepatitis A and B immunity if not immune would require immunization  3.  Suggest oral iron and if does not appropriately raise counts in a few weeks will suggest IV iron.  Her ferritin was 9 in May 2021  4.  Vitamin D replacement  5.  MR enterogram to evaluate small bowel for presence or absence of Crohn's disease.  6.  She has been on 6-MP for many years and hence suggest she requires a annual skin exam as there is an increased risk of nonmelanoma  skin cancer.   7.  DEXA bone scan to screen for osteoporosis as she is postmenopausal, status post hysterectomy hence no  Pap smear  8.  Advised to stop her Celebrex as it can cause ulceration  Follow up in 4 weeks  Dr Jonathon Bellows MD,MRCP(U.K)

## 2020-07-01 LAB — HEPATITIS A ANTIBODY, TOTAL: hep A Total Ab: NEGATIVE

## 2020-07-01 LAB — HEPATITIS B E ANTIGEN: Hep B E Ag: NEGATIVE

## 2020-07-01 LAB — HEPATITIS B SURFACE ANTIBODY,QUALITATIVE: Hep B Surface Ab, Qual: NONREACTIVE

## 2020-07-01 LAB — HEPATITIS B SURFACE ANTIGEN: Hepatitis B Surface Ag: NEGATIVE

## 2020-07-07 ENCOUNTER — Encounter: Payer: Self-pay | Admitting: Gastroenterology

## 2020-07-08 ENCOUNTER — Telehealth: Payer: Self-pay

## 2020-07-08 NOTE — Telephone Encounter (Signed)
-----   Message from Jonathon Bellows, MD sent at 07/07/2020  8:36 AM EST ----- Not immune to hep A/B needs immunization

## 2020-07-10 ENCOUNTER — Ambulatory Visit: Payer: Self-pay | Admitting: General Surgery

## 2020-07-10 DIAGNOSIS — N6022 Fibroadenosis of left breast: Secondary | ICD-10-CM

## 2020-07-16 ENCOUNTER — Ambulatory Visit (INDEPENDENT_AMBULATORY_CARE_PROVIDER_SITE_OTHER): Payer: 59 | Admitting: Dermatology

## 2020-07-16 ENCOUNTER — Other Ambulatory Visit: Payer: Self-pay

## 2020-07-16 DIAGNOSIS — L820 Inflamed seborrheic keratosis: Secondary | ICD-10-CM | POA: Diagnosis not present

## 2020-07-16 NOTE — Progress Notes (Signed)
° °  Follow-Up Visit   Subjective  [name] is a 54 y.o. female who presents for the following: Follow-up (recheck ISK on left forearm).   The following portions of the chart were reviewed this encounter and updated as appropriate:  Tobacco   Allergies   Meds   Problems   Med Hx   Surg Hx   Fam Hx       Review of Systems: No other skin or systemic complaints except as noted in HPI or Assessment and Plan.   Objective  Well appearing patient in no apparent distress; mood and affect are within normal limits.  A focused examination was performed including left forearm and face. Relevant physical exam findings are noted in the Assessment and Plan.  Objective  Left Forearm - Posterior: Erythematous keratotic or waxy stuck-on papule or plaque.   Assessment & Plan  Inflamed seborrheic keratosis Left Forearm - Posterior  Prior to procedure, discussed risks of blister formation, small wound, skin dyspigmentation, or rare scar following cryotherapy.   May consider biopsy if not responding as expected to therapy.  Destruction of lesion - Left Forearm - Posterior  Destruction method: cryotherapy   Informed consent: discussed and consent obtained   Lesion destroyed using liquid nitrogen: Yes   Cryotherapy cycles:  2 Outcome: patient tolerated procedure well with no complications   Post-procedure details: wound care instructions given    Out of an abundance of caution, will defer botox today as she just had COVID booster today.  Recommend waiting 2 weeks after booster.  Return in about 6 weeks (around 08/27/2020) for recheck ISK, discuss botox and halo.   I, Harriett Sine, CMA, am acting as scribe for Forest Gleason, MD.  Documentation: I have reviewed the above documentation for accuracy and completeness, and I agree with the above.  Forest Gleason, MD

## 2020-07-16 NOTE — Progress Notes (Signed)
Additional note entered in error

## 2020-07-17 ENCOUNTER — Encounter: Payer: Self-pay | Admitting: Dermatology

## 2020-07-22 ENCOUNTER — Other Ambulatory Visit: Payer: Self-pay

## 2020-07-22 ENCOUNTER — Ambulatory Visit
Admission: RE | Admit: 2020-07-22 | Discharge: 2020-07-22 | Disposition: A | Discharge status: Home / Self Care | Payer: 59 | Source: Ambulatory Visit | Attending: Gastroenterology | Admitting: Gastroenterology

## 2020-07-22 DIAGNOSIS — K5010 Crohn's disease of large intestine without complications: Secondary | ICD-10-CM | POA: Diagnosis present

## 2020-07-22 MED ORDER — GADOBUTROL 1 MMOL/ML IV SOLN
7.0000 mL | Freq: Once | INTRAVENOUS | Status: AC | PRN
  Administered 2020-07-22: 7 mL via INTRAVENOUS

## 2020-07-25 ENCOUNTER — Telehealth: Payer: Self-pay

## 2020-07-25 NOTE — Telephone Encounter (Signed)
-----   Message from Jonathon Bellows, MD sent at 07/24/2020 10:22 AM EST ----- [name]   Inform patient that there was some technical limitations of the MRI but within the limits of the study there was some abnormality in the form of possible inflammation seen in the distal part of the small intestine.  The radiologist has suggested to proceed with a CT enterography to look at this area a bit better.  Ensure she has stopped taking her Celebrex  Dr Jonathon Bellows MD,MRCP [facility]) Gastroenterology/Hepatology Pager: [phone]

## 2020-07-28 ENCOUNTER — Other Ambulatory Visit: Payer: Self-pay | Admitting: General Surgery

## 2020-07-28 ENCOUNTER — Other Ambulatory Visit: Payer: 59

## 2020-07-28 DIAGNOSIS — N6022 Fibroadenosis of left breast: Secondary | ICD-10-CM

## 2020-08-07 ENCOUNTER — Ambulatory Visit
Admission: RE | Admit: 2020-08-07 | Discharge: 2020-08-07 | Disposition: A | Discharge status: Home / Self Care | Payer: 59 | Source: Ambulatory Visit | Attending: Gastroenterology | Admitting: Gastroenterology

## 2020-08-07 ENCOUNTER — Other Ambulatory Visit: Payer: Self-pay

## 2020-08-07 DIAGNOSIS — Z13820 Encounter for screening for osteoporosis: Secondary | ICD-10-CM

## 2020-08-07 DIAGNOSIS — K5010 Crohn's disease of large intestine without complications: Secondary | ICD-10-CM | POA: Diagnosis not present

## 2020-08-13 ENCOUNTER — Other Ambulatory Visit: Payer: Self-pay

## 2020-08-13 ENCOUNTER — Other Ambulatory Visit
Admission: RE | Admit: 2020-08-13 | Discharge: 2020-08-13 | Disposition: A | Discharge status: Home / Self Care | Payer: 59 | Source: Ambulatory Visit | Attending: Gastroenterology | Admitting: Gastroenterology

## 2020-08-13 DIAGNOSIS — Z01818 Encounter for other preprocedural examination: Secondary | ICD-10-CM | POA: Diagnosis present

## 2020-08-13 DIAGNOSIS — Z20822 Contact with and (suspected) exposure to covid-19: Secondary | ICD-10-CM | POA: Insufficient documentation

## 2020-08-13 LAB — SARS CORONAVIRUS 2 (TAT 6-24 HRS): SARS Coronavirus 2: NEGATIVE

## 2020-08-15 ENCOUNTER — Ambulatory Visit: Payer: 59 | Admitting: Certified Registered"

## 2020-08-15 ENCOUNTER — Ambulatory Visit
Admission: RE | Admit: 2020-08-15 | Discharge: 2020-08-15 | Disposition: A | Discharge status: Home / Self Care | Payer: 59 | Attending: Gastroenterology | Admitting: Gastroenterology

## 2020-08-15 ENCOUNTER — Encounter: Payer: Self-pay | Admitting: Gastroenterology

## 2020-08-15 ENCOUNTER — Other Ambulatory Visit: Payer: Self-pay

## 2020-08-15 ENCOUNTER — Encounter
Admission: RE | Disposition: A | Discharge status: Home / Self Care | Payer: Self-pay | Source: Home / Self Care | Attending: Gastroenterology

## 2020-08-15 DIAGNOSIS — Z79899 Other long term (current) drug therapy: Secondary | ICD-10-CM | POA: Insufficient documentation

## 2020-08-15 DIAGNOSIS — K5730 Diverticulosis of large intestine without perforation or abscess without bleeding: Secondary | ICD-10-CM | POA: Insufficient documentation

## 2020-08-15 DIAGNOSIS — K56699 Other intestinal obstruction unspecified as to partial versus complete obstruction: Secondary | ICD-10-CM | POA: Insufficient documentation

## 2020-08-15 DIAGNOSIS — K6289 Other specified diseases of anus and rectum: Secondary | ICD-10-CM | POA: Diagnosis not present

## 2020-08-15 DIAGNOSIS — K529 Noninfective gastroenteritis and colitis, unspecified: Secondary | ICD-10-CM | POA: Diagnosis not present

## 2020-08-15 DIAGNOSIS — K5010 Crohn's disease of large intestine without complications: Secondary | ICD-10-CM | POA: Diagnosis not present

## 2020-08-15 HISTORY — PX: COLONOSCOPY WITH PROPOFOL: SHX5780

## 2020-08-15 SURGERY — COLONOSCOPY WITH PROPOFOL
Anesthesia: General

## 2020-08-15 MED ORDER — LIDOCAINE HCL (CARDIAC) PF 100 MG/5ML IV SOSY
PREFILLED_SYRINGE | INTRAVENOUS | Status: DC | PRN
  Administered 2020-08-15: 100 mg via INTRAVENOUS

## 2020-08-15 MED ORDER — GLYCOPYRROLATE 0.2 MG/ML IJ SOLN
INTRAMUSCULAR | Status: DC | PRN
  Administered 2020-08-15: .2 mg via INTRAVENOUS

## 2020-08-15 MED ORDER — PROPOFOL 500 MG/50ML IV EMUL
INTRAVENOUS | Status: DC | PRN
  Administered 2020-08-15: 165 ug/kg/min via INTRAVENOUS

## 2020-08-15 MED ORDER — SODIUM CHLORIDE 0.9 % IV SOLN
INTRAVENOUS | Status: DC
  Administered 2020-08-15: 1000 mL via INTRAVENOUS

## 2020-08-15 MED ORDER — PROPOFOL 10 MG/ML IV BOLUS
INTRAVENOUS | Status: DC | PRN
  Administered 2020-08-15: 50 mg via INTRAVENOUS

## 2020-08-15 NOTE — Anesthesia Procedure Notes (Signed)
Procedure Name: General with mask airway Performed by: Fletcher-Harrison, Kortne All, CRNA Pre-anesthesia Checklist: Patient identified, Emergency Drugs available, Suction available and Patient being monitored Patient Re-evaluated:Patient Re-evaluated prior to induction Oxygen Delivery Method: Simple face mask Induction Type: IV induction Placement Confirmation: positive ETCO2 and CO2 detector Dental Injury: Teeth and Oropharynx as per pre-operative assessment        

## 2020-08-15 NOTE — Transfer of Care (Signed)
Immediate Anesthesia Transfer of Care Note  Patient: [name]  Procedure(s) Performed: COLONOSCOPY WITH PROPOFOL (N/A )  Patient Location: Endoscopy Unit  Anesthesia Type:General  Level of Consciousness: drowsy and patient cooperative  Airway & Oxygen Therapy: Patient Spontanous Breathing and Patient connected to face mask oxygen  Post-op Assessment: Report given to RN and Post -op Vital signs reviewed and stable  Post vital signs: Reviewed and stable  Last Vitals:  Vitals Value Taken Time  BP    Temp    Pulse    Resp 17 08/15/20 1145  SpO2    Vitals shown include unvalidated device data.  Last Pain:  Vitals:   08/15/20 1048  TempSrc: Temporal  PainSc: 0-No pain         Complications: No complications documented.

## 2020-08-15 NOTE — Anesthesia Postprocedure Evaluation (Signed)
Anesthesia Post Note  Patient: [name]  Procedure(s) Performed: COLONOSCOPY WITH PROPOFOL (N/A )  Patient location during evaluation: Endoscopy Anesthesia Type: General Level of consciousness: awake and alert Pain management: pain level controlled Vital Signs Assessment: post-procedure vital signs reviewed and stable Respiratory status: spontaneous breathing, nonlabored ventilation, respiratory function stable and patient connected to nasal cannula oxygen Cardiovascular status: blood pressure returned to baseline and stable Postop Assessment: no apparent nausea or vomiting Anesthetic complications: no   No complications documented.   Last Vitals:  Vitals:   08/15/20 1205 08/15/20 1215  BP: 113/61 124/68  Pulse: 68 79  Resp: 17 17  Temp:    SpO2: 100% 100%    Last Pain:  Vitals:   08/15/20 1215  TempSrc:   PainSc: 0-No pain                 Martha Clan

## 2020-08-15 NOTE — Op Note (Signed)
[facility] Patient Name: [name] Procedure Date: 08/15/2020 11:22 AM MRN: [id number] Account #: [id number] Date of Birth: 1966-06-29 Admit Type: Outpatient Age: 55 Room: [facility] ENDO ROOM 4 Gender: Female Note Status: Finalized Procedure:             Colonoscopy Indications:           Follow-up of Crohn's disease of the colon Providers:             Jonathon Bellows MD, MD Referring MD:          No Local Md, MD (Referring MD) Medicines:             Monitored Anesthesia Care Complications:         No immediate complications. Procedure:             Pre-Anesthesia Assessment:                        - Prior to the procedure, a History and Physical was                         performed, and patient medications, allergies and                         sensitivities were reviewed. The patient's tolerance                         of previous anesthesia was reviewed.                        - The risks and benefits of the procedure and the                         sedation options and risks were discussed with the                         patient. All questions were answered and informed                         consent was obtained.                        - ASA Grade Assessment: II - A patient with mild                         systemic disease.                        After obtaining informed consent, the colonoscope was                         passed under direct vision. Throughout the procedure,                         the patient's blood pressure, pulse, and oxygen                         saturations were monitored continuously. The                         Colonoscope was introduced through  the anus and                         advanced to the the cecum, identified by the                         appendiceal orifice. The colonoscopy was performed                         with ease. The patient tolerated the procedure well.                         The quality of the  bowel preparation was excellent. 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      The mucosa vascular pattern in the transverse colon, in the ascending       colon and in the cecum was diffusely decreased.      A few seven mm ulcers were found in the transverse colon and in the       ascending colon. No bleeding was present. No stigmata of recent bleeding       were seen. Biopsies were taken with a cold forceps for histology.       Biopsies were taken with a cold forceps for histology. Bioppsies taken       from entire colon to rule out active crohns disease      The exam was otherwise without abnormality.      Many small-mouthed diverticula were found in the sigmoid colon. Impression:            - Abnormal digital rectal exam.                        - Stricture at the ileocecal valve.                        - Decreased mucosa vascular pattern in the transverse                         colon, in the ascending colon and in the cecum.                        - A few ulcers in the transverse colon and in the                         ascending colon. Biopsied.                        - The examination was otherwise normal. Recommendation:        - Discharge patient to home (with escort).                        - Resume previous diet.                        - Continue present medications.                        -  Await pathology results.                        - Return to my office as previously scheduled.                        - Likely will need escalation of therapy Procedure Code(s):     --- Professional ---                        [phone], Colonoscopy, flexible; with biopsy, single or                         multiple Diagnosis Code(s):     --- Professional ---                        K62.89, Other specified diseases of anus  and rectum                        K50.112, Crohn's disease of large intestine with                         intestinal obstruction                        K63.3, Ulcer of intestine CPT copyright 2019 [employer] Medical Association. All rights reserved. The codes documented in this report are preliminary and upon coder review may  be revised to meet current compliance requirements. Jonathon Bellows, MD Jonathon Bellows MD, MD 08/15/2020 11:46:59 AM This report has been signed electronically. Number of Addenda: 0 Note Initiated On: 08/15/2020 11:22 AM Scope Withdrawal Time: 0 hours 11 minutes 1 second  Total Procedure Duration: 0 hours 14 minutes 29 seconds  Estimated Blood Loss:  Estimated blood loss: none.      [facility]

## 2020-08-15 NOTE — H&P (Signed)
Jonathon Bellows, MD [address] [place], [place], [place], [address] [id number], [place], [place], [place], [address] Phone: [phone]  Fax: [phone]  Primary Care Physician:  Marval Regal, NP   Pre-Procedure History & Physical: HPI:  [name] is a 55 y.o. female is here for an colonoscopy.   Past Medical History:  Diagnosis Date  . Crohn's disease ([facility])   . Polyp of colon     Past Surgical History:  Procedure Laterality Date  . ABDOMINAL HYSTERECTOMY    . ANTERIOR AND POSTERIOR REPAIR N/A 11/17/2012   Procedure: ANTERIOR (CYSTOCELE) AND POSTERIOR (RECTOCELE), Enterocele repair.;  Surgeon: Lovenia Kim, MD;  Location: [facility];  Service: Gynecology;  Laterality: N/A;  Also Enterocele repair.  Marland Kitchen BILATERAL SALPINGECTOMY Bilateral 11/17/2012   Procedure: BILATERAL SALPINGECTOMY;  Surgeon: Lovenia Kim, MD;  Location: [facility];  Service: Gynecology;  Laterality: Bilateral;  . BLADDER SUSPENSION N/A 11/17/2012   Procedure: TRANSVAGINAL TAPE (TVT) PROCEDURE Sling with Cystoscopy.;  Surgeon: Lovenia Kim, MD;  Location: [facility];  Service: Gynecology;  Laterality: N/A;  TVT Sling with Cystoscopy.  Marland Kitchen BREAST SURGERY    . MANDIBLE SURGERY    . ROBOTIC ASSISTED TOTAL HYSTERECTOMY N/A 11/17/2012   Procedure: ROBOTIC ASSISTED TOTAL HYSTERECTOMY;  Surgeon: Lovenia Kim, MD;  Location: [facility];  Service: Gynecology;  Laterality: N/A;  Requests 3 1/2 hrs.    Prior to Admission medications   Medication Sig Start Date End Date Taking? Authorizing Provider  balsalazide (COLAZAL) 750 MG capsule TAKE 3 CAPSULES (2,250 MG TOTAL) BY MOUTH 3 (THREE) TIMES DAILY. 01/10/19  Yes [provider]  CALCIUM-VITAMIN D PO Take 1 tablet by mouth daily.   Yes [provider]  gabapentin (NEURONTIN) 300 MG capsule TAKE 1 CAPSULE BY MOUTH TWICE A DAY 05/10/18  Yes [provider]  mercaptopurine (PURINETHOL) 50 MG tablet Take 50 mg by mouth daily. Give on an  empty stomach 1 hour before or 2 hours after meals. Caution: Chemotherapy.   Yes [provider]  Multiple Vitamin (MULTI-VITAMIN) tablet Take by mouth.   Yes [provider]  celecoxib (CELEBREX) 200 MG capsule TAKE 1 CAPSULE ONCE A DAY AS NEEDED Patient not taking: Reported on 08/15/2020 05/10/18   [provider]    Allergies as of 07/01/2020 - Review Complete 06/30/2020  Allergen Reaction Noted  . Sulfa antibiotics Anaphylaxis and Swelling 11/11/2012    Family History  Problem Relation Age of Onset  . Arthritis Mother   . Cancer Mother   . COPD Mother   . Hearing loss Mother   . Alcohol abuse Father   . Early death Father   . Arthritis Brother   . Diabetes Maternal Grandmother   . Heart disease Maternal Grandmother   . COPD Maternal Grandfather     Social History   Socioeconomic History  . Marital status: Married    Spouse name: Not on file  . Number of children: Not on file  . Years of education: Not on file  . Highest education level: Some college, no degree  Occupational History  . Not on file  Tobacco Use  . Smoking status: Never Smoker  . Smokeless tobacco: Never Used  Vaping Use  . Vaping Use: Never used  Substance and Sexual Activity  . Alcohol use: No  . Drug use: No  . Sexual activity: Not Currently  Other Topics Concern  . Not on file  Social History Narrative  .  Not on file   Social Determinants of Health   Financial Resource Strain: Not on file  Food Insecurity: Not on file  Transportation Needs: Not on file  Physical Activity: Not on file  Stress: Not on file  Social Connections: Not on file  Intimate Partner Violence: Not on file    Review of Systems: See HPI, otherwise negative ROS  Physical Exam: BP 124/70   Pulse 66   Temp (!) 97.2 F (36.2 C) (Temporal)   Resp 18   Ht 5' 4.5" (1.638 m)   Wt 75 kg   LMP 09/15/2012   SpO2 100%   BMI 27.94 kg/m  General:   Alert,  pleasant and cooperative in  NAD Head:  Normocephalic and atraumatic. Neck:  Supple; no masses or thyromegaly. Lungs:  Clear throughout to auscultation, normal respiratory effort.    Heart:  +S1, +S2, Regular rate and rhythm, No edema. Abdomen:  Soft, nontender and nondistended. Normal bowel sounds, without guarding, and without rebound.   Neurologic:  Alert and  oriented x4;  grossly normal neurologically.  Impression/Plan: [name] is here for an colonoscopy to be performed for Crohns disease . Risks, benefits, limitations, and alternatives regarding  colonoscopy have been reviewed with the patient.  Questions have been answered.  All parties agreeable.   Jonathon Bellows, MD  08/15/2020, 11:09 AM

## 2020-08-15 NOTE — Anesthesia Preprocedure Evaluation (Signed)
Anesthesia Evaluation  Patient identified by MRN, date of birth, ID band Patient awake    Reviewed: Allergy & Precautions, H&P , NPO status , Patient's Chart, lab work & pertinent test results, reviewed documented beta blocker date and time   History of Anesthesia Complications Negative for: history of anesthetic complications  Airway Mallampati: II  TM Distance: >3 FB Neck ROM: full    Dental  (+) Dental Advidsory Given   Pulmonary neg pulmonary ROS,    Pulmonary exam normal breath sounds clear to auscultation       Cardiovascular Exercise Tolerance: Good negative cardio ROS Normal cardiovascular exam Rhythm:regular Rate:Normal     Neuro/Psych negative neurological ROS  negative psych ROS   GI/Hepatic negative GI ROS, Neg liver ROS,   Endo/Other  negative endocrine ROS  Renal/GU negative Renal ROS  negative genitourinary   Musculoskeletal   Abdominal   Peds  Hematology  (+) Blood dyscrasia, anemia ,   Anesthesia Other Findings Past Medical History: No date: Crohn's disease ([facility]) No date: Polyp of colon   Reproductive/Obstetrics negative OB ROS                             Anesthesia Physical Anesthesia Plan  ASA: II  Anesthesia Plan: General   Post-op Pain Management:    Induction:   PONV Risk Score and Plan:   Airway Management Planned:   Additional Equipment:   Intra-op Plan:   Post-operative Plan:   Informed Consent: I have reviewed the patients History and Physical, chart, labs and discussed the procedure including the risks, benefits and alternatives for the proposed anesthesia with the patient or authorized representative who has indicated his/her understanding and acceptance.     Dental Advisory Given  Plan Discussed with: Anesthesiologist, CRNA and Surgeon  Anesthesia Plan Comments:         Anesthesia Quick Evaluation

## 2020-08-18 LAB — SURGICAL PATHOLOGY

## 2020-08-19 ENCOUNTER — Encounter: Payer: Self-pay | Admitting: Gastroenterology

## 2020-08-19 ENCOUNTER — Telehealth: Payer: Self-pay

## 2020-08-19 NOTE — Telephone Encounter (Signed)
-----   Message from Jonathon Bellows, MD sent at 08/19/2020  8:31 AM EST ----- Inform biopsies taken showed mild inflammation- needs office visit to discuss next steps. Please schedule after this week

## 2020-08-19 NOTE — Telephone Encounter (Signed)
Called to inform patient of results from biopsy. Unable to leave voicemail, box is full.

## 2020-08-21 ENCOUNTER — Telehealth: Payer: Self-pay

## 2020-08-21 NOTE — Telephone Encounter (Signed)
-----   Message from Jonathon Bellows, MD sent at 08/19/2020  8:31 AM EST ----- Inform biopsies taken showed mild inflammation- needs office visit to discuss next steps. Please schedule after this week

## 2020-08-21 NOTE — Telephone Encounter (Signed)
Called and informed patient of results per Dr. Vicente Males. LVM to call office if patient has any other questions.

## 2020-08-27 ENCOUNTER — Ambulatory Visit: Payer: 59 | Admitting: Dermatology

## 2020-08-27 ENCOUNTER — Telehealth: Payer: Self-pay

## 2020-08-27 NOTE — Telephone Encounter (Signed)
-----   Message from Jonathon Bellows, MD sent at 08/27/2020  9:28 AM EST ----- Inform the biopsies of the colon showed mild to moderate inflammatory activity.  This would indicate that the existing medications that she is taking have not been effective.  We will definitely need to increase her therapy.  I will discuss this further at her follow-up visit.  Avoid any NSAIDs.

## 2020-08-27 NOTE — Telephone Encounter (Signed)
Patient has been informed of her biopsy results and Dr. Vicente Males recommendations. Pt verbalized understanding.

## 2020-08-29 ENCOUNTER — Other Ambulatory Visit: Payer: Self-pay

## 2020-08-29 ENCOUNTER — Encounter (HOSPITAL_BASED_OUTPATIENT_CLINIC_OR_DEPARTMENT_OTHER): Payer: Self-pay | Admitting: General Surgery

## 2020-09-02 ENCOUNTER — Other Ambulatory Visit (HOSPITAL_COMMUNITY)
Admission: RE | Admit: 2020-09-02 | Discharge: 2020-09-02 | Disposition: A | Discharge status: Home / Self Care | Payer: 59 | Source: Ambulatory Visit | Attending: General Surgery | Admitting: General Surgery

## 2020-09-02 ENCOUNTER — Other Ambulatory Visit (HOSPITAL_COMMUNITY): Payer: 59

## 2020-09-02 DIAGNOSIS — Z20822 Contact with and (suspected) exposure to covid-19: Secondary | ICD-10-CM | POA: Insufficient documentation

## 2020-09-02 DIAGNOSIS — Z01812 Encounter for preprocedural laboratory examination: Secondary | ICD-10-CM | POA: Insufficient documentation

## 2020-09-02 LAB — SARS CORONAVIRUS 2 (TAT 6-24 HRS): SARS Coronavirus 2: NEGATIVE

## 2020-09-02 NOTE — Progress Notes (Signed)

## 2020-09-04 ENCOUNTER — Ambulatory Visit
Admission: RE | Admit: 2020-09-04 | Discharge: 2020-09-04 | Disposition: A | Discharge status: Home / Self Care | Payer: 59 | Source: Ambulatory Visit | Attending: General Surgery | Admitting: General Surgery

## 2020-09-04 ENCOUNTER — Other Ambulatory Visit: Payer: Self-pay

## 2020-09-04 DIAGNOSIS — N6022 Fibroadenosis of left breast: Secondary | ICD-10-CM

## 2020-09-05 ENCOUNTER — Ambulatory Visit (HOSPITAL_BASED_OUTPATIENT_CLINIC_OR_DEPARTMENT_OTHER): Payer: 59 | Admitting: Anesthesiology

## 2020-09-05 ENCOUNTER — Encounter (HOSPITAL_BASED_OUTPATIENT_CLINIC_OR_DEPARTMENT_OTHER): Payer: Self-pay | Admitting: General Surgery

## 2020-09-05 ENCOUNTER — Ambulatory Visit (HOSPITAL_BASED_OUTPATIENT_CLINIC_OR_DEPARTMENT_OTHER)
Admission: RE | Admit: 2020-09-05 | Discharge: 2020-09-05 | Disposition: A | Discharge status: Home / Self Care | Payer: 59 | Attending: General Surgery | Admitting: General Surgery

## 2020-09-05 ENCOUNTER — Encounter (HOSPITAL_BASED_OUTPATIENT_CLINIC_OR_DEPARTMENT_OTHER)
Admission: RE | Disposition: A | Discharge status: Home / Self Care | Payer: Self-pay | Source: Home / Self Care | Attending: General Surgery

## 2020-09-05 ENCOUNTER — Other Ambulatory Visit: Payer: Self-pay

## 2020-09-05 ENCOUNTER — Ambulatory Visit
Admission: RE | Admit: 2020-09-05 | Discharge: 2020-09-05 | Disposition: A | Discharge status: Home / Self Care | Payer: 59 | Source: Ambulatory Visit | Attending: General Surgery | Admitting: General Surgery

## 2020-09-05 DIAGNOSIS — Z86010 Personal history of colonic polyps: Secondary | ICD-10-CM | POA: Diagnosis not present

## 2020-09-05 DIAGNOSIS — Z882 Allergy status to sulfonamides status: Secondary | ICD-10-CM | POA: Diagnosis not present

## 2020-09-05 DIAGNOSIS — N6489 Other specified disorders of breast: Secondary | ICD-10-CM | POA: Insufficient documentation

## 2020-09-05 DIAGNOSIS — N6022 Fibroadenosis of left breast: Secondary | ICD-10-CM

## 2020-09-05 DIAGNOSIS — Z808 Family history of malignant neoplasm of other organs or systems: Secondary | ICD-10-CM | POA: Insufficient documentation

## 2020-09-05 HISTORY — DX: Anemia, unspecified: D64.9

## 2020-09-05 HISTORY — PX: BREAST LUMPECTOMY WITH RADIOACTIVE SEED LOCALIZATION: SHX6424

## 2020-09-05 SURGERY — BREAST LUMPECTOMY WITH RADIOACTIVE SEED LOCALIZATION
Anesthesia: General | Site: Breast | Laterality: Left

## 2020-09-05 MED ORDER — CEFAZOLIN SODIUM-DEXTROSE 2-4 GM/100ML-% IV SOLN
2.0000 g | INTRAVENOUS | Status: AC
  Administered 2020-09-05: 2 g via INTRAVENOUS

## 2020-09-05 MED ORDER — MIDAZOLAM HCL 2 MG/2ML IJ SOLN
INTRAMUSCULAR | Status: AC
  Filled 2020-09-05: qty 2

## 2020-09-05 MED ORDER — BUPIVACAINE HCL (PF) 0.25 % IJ SOLN
INTRAMUSCULAR | Status: DC | PRN
  Administered 2020-09-05: 20 mL

## 2020-09-05 MED ORDER — PHENYLEPHRINE HCL (PRESSORS) 10 MG/ML IV SOLN
INTRAVENOUS | Status: DC | PRN
  Administered 2020-09-05 (×4): 40 ug via INTRAVENOUS
  Administered 2020-09-05: 80 ug via INTRAVENOUS
  Administered 2020-09-05 (×2): 40 ug via INTRAVENOUS
  Administered 2020-09-05: 80 ug via INTRAVENOUS

## 2020-09-05 MED ORDER — PHENYLEPHRINE 40 MCG/ML (10ML) SYRINGE FOR IV PUSH (FOR BLOOD PRESSURE SUPPORT)
PREFILLED_SYRINGE | INTRAVENOUS | Status: AC
  Filled 2020-09-05: qty 10

## 2020-09-05 MED ORDER — GABAPENTIN 300 MG PO CAPS
ORAL_CAPSULE | ORAL | Status: AC
  Filled 2020-09-05: qty 1

## 2020-09-05 MED ORDER — ACETAMINOPHEN 500 MG PO TABS
ORAL_TABLET | ORAL | Status: AC
  Filled 2020-09-05: qty 2

## 2020-09-05 MED ORDER — EPHEDRINE SULFATE 50 MG/ML IJ SOLN
INTRAMUSCULAR | Status: DC | PRN
  Administered 2020-09-05 (×2): 5 mg via INTRAVENOUS

## 2020-09-05 MED ORDER — DEXAMETHASONE SODIUM PHOSPHATE 10 MG/ML IJ SOLN
INTRAMUSCULAR | Status: AC
  Filled 2020-09-05: qty 1

## 2020-09-05 MED ORDER — LIDOCAINE 2% (20 MG/ML) 5 ML SYRINGE
INTRAMUSCULAR | Status: AC
  Filled 2020-09-05: qty 5

## 2020-09-05 MED ORDER — MIDAZOLAM HCL 5 MG/5ML IJ SOLN
INTRAMUSCULAR | Status: DC | PRN
  Administered 2020-09-05: 2 mg via INTRAVENOUS

## 2020-09-05 MED ORDER — ONDANSETRON HCL 4 MG/2ML IJ SOLN
INTRAMUSCULAR | Status: DC | PRN
  Administered 2020-09-05: 4 mg via INTRAVENOUS

## 2020-09-05 MED ORDER — ONDANSETRON HCL 4 MG/2ML IJ SOLN
INTRAMUSCULAR | Status: AC
  Filled 2020-09-05: qty 2

## 2020-09-05 MED ORDER — FENTANYL CITRATE (PF) 100 MCG/2ML IJ SOLN
INTRAMUSCULAR | Status: DC | PRN
  Administered 2020-09-05: 50 ug via INTRAVENOUS

## 2020-09-05 MED ORDER — LIDOCAINE HCL (CARDIAC) PF 100 MG/5ML IV SOSY
PREFILLED_SYRINGE | INTRAVENOUS | Status: DC | PRN
  Administered 2020-09-05: 60 mg via INTRAVENOUS

## 2020-09-05 MED ORDER — PROPOFOL 10 MG/ML IV BOLUS
INTRAVENOUS | Status: DC | PRN
  Administered 2020-09-05: 150 mg via INTRAVENOUS

## 2020-09-05 MED ORDER — FENTANYL CITRATE (PF) 100 MCG/2ML IJ SOLN
INTRAMUSCULAR | Status: AC
  Filled 2020-09-05: qty 2

## 2020-09-05 MED ORDER — HYDROCODONE-ACETAMINOPHEN 5-325 MG PO TABS: 1.0000 | ORAL_TABLET | Freq: Four times a day (QID) | ORAL | 0 refills | Status: DC | PRN

## 2020-09-05 MED ORDER — ACETAMINOPHEN 500 MG PO TABS
1000.0000 mg | ORAL_TABLET | ORAL | Status: AC
  Administered 2020-09-05: 1000 mg via ORAL

## 2020-09-05 MED ORDER — CEFAZOLIN SODIUM-DEXTROSE 2-4 GM/100ML-% IV SOLN
INTRAVENOUS | Status: AC
  Filled 2020-09-05: qty 100

## 2020-09-05 MED ORDER — EPHEDRINE 5 MG/ML INJ
INTRAVENOUS | Status: AC
  Filled 2020-09-05: qty 10

## 2020-09-05 MED ORDER — FENTANYL CITRATE (PF) 100 MCG/2ML IJ SOLN: 25.0000 ug | INTRAMUSCULAR | Status: DC | PRN

## 2020-09-05 MED ORDER — CHLORHEXIDINE GLUCONATE CLOTH 2 % EX PADS: 6.0000 | MEDICATED_PAD | Freq: Once | CUTANEOUS | Status: DC

## 2020-09-05 MED ORDER — DEXAMETHASONE SODIUM PHOSPHATE 4 MG/ML IJ SOLN
INTRAMUSCULAR | Status: DC | PRN
  Administered 2020-09-05: 8 mg via INTRAVENOUS

## 2020-09-05 MED ORDER — LACTATED RINGERS IV SOLN: INTRAVENOUS | Status: DC

## 2020-09-05 MED ORDER — GABAPENTIN 300 MG PO CAPS
300.0000 mg | ORAL_CAPSULE | ORAL | Status: AC
  Administered 2020-09-05: 300 mg via ORAL

## 2020-09-05 SURGICAL SUPPLY — 44 items
ADH SKN CLS APL DERMABOND .7 (GAUZE/BANDAGES/DRESSINGS) ×1
APL PRP STRL LF DISP 70% ISPRP (MISCELLANEOUS) ×1
APPLIER CLIP 9.375 MED OPEN (MISCELLANEOUS)
APR CLP MED 9.3 20 MLT OPN (MISCELLANEOUS)
BLADE SURG 15 STRL LF DISP TIS (BLADE) ×1 IMPLANT
BLADE SURG 15 STRL SS (BLADE) ×2
CANISTER SUC SOCK COL 7IN (MISCELLANEOUS) IMPLANT
CANISTER SUCT 1200ML W/VALVE (MISCELLANEOUS) ×2 IMPLANT
CHLORAPREP W/TINT 26 (MISCELLANEOUS) ×2 IMPLANT
CLIP APPLIE 9.375 MED OPEN (MISCELLANEOUS) IMPLANT
COVER BACK TABLE 60X90IN (DRAPES) ×2 IMPLANT
COVER MAYO STAND STRL (DRAPES) ×2 IMPLANT
COVER PROBE W GEL 5X96 (DRAPES) ×2 IMPLANT
COVER WAND RF STERILE (DRAPES) IMPLANT
DECANTER SPIKE VIAL GLASS SM (MISCELLANEOUS) IMPLANT
DERMABOND ADVANCED (GAUZE/BANDAGES/DRESSINGS) ×1
DERMABOND ADVANCED .7 DNX12 (GAUZE/BANDAGES/DRESSINGS) ×1 IMPLANT
DRAPE LAPAROSCOPIC ABDOMINAL (DRAPES) ×2 IMPLANT
DRAPE UTILITY XL STRL (DRAPES) ×2 IMPLANT
ELECT COATED BLADE 2.86 ST (ELECTRODE) ×2 IMPLANT
ELECT REM PT RETURN 9FT ADLT (ELECTROSURGICAL) ×2
ELECTRODE REM PT RTRN 9FT ADLT (ELECTROSURGICAL) ×1 IMPLANT
GLOVE BIO SURGEON STRL SZ7.5 (GLOVE) ×4 IMPLANT
GLOVE ECLIPSE 6.5 STRL STRAW (GLOVE) ×4 IMPLANT
GLOVE SURG UNDER POLY LF SZ7 (GLOVE) ×2 IMPLANT
GOWN STRL REUS W/ TWL LRG LVL3 (GOWN DISPOSABLE) ×2 IMPLANT
GOWN STRL REUS W/TWL LRG LVL3 (GOWN DISPOSABLE) ×4
ILLUMINATOR WAVEGUIDE N/F (MISCELLANEOUS) IMPLANT
KIT MARKER MARGIN INK (KITS) ×2 IMPLANT
LIGHT WAVEGUIDE WIDE FLAT (MISCELLANEOUS) IMPLANT
NEEDLE HYPO 25X1 1.5 SAFETY (NEEDLE) ×2 IMPLANT
NS IRRIG 1000ML POUR BTL (IV SOLUTION) ×2 IMPLANT
PACK BASIN DAY SURGERY FS (CUSTOM PROCEDURE TRAY) ×2 IMPLANT
PENCIL SMOKE EVACUATOR (MISCELLANEOUS) ×2 IMPLANT
SLEEVE SCD COMPRESS KNEE MED (MISCELLANEOUS) ×2 IMPLANT
SPONGE LAP 18X18 RF (DISPOSABLE) ×2 IMPLANT
SUT MON AB 4-0 PC3 18 (SUTURE) ×2 IMPLANT
SUT SILK 2 0 SH (SUTURE) IMPLANT
SUT VICRYL 3-0 CR8 SH (SUTURE) ×2 IMPLANT
SYR CONTROL 10ML LL (SYRINGE) ×2 IMPLANT
TOWEL GREEN STERILE FF (TOWEL DISPOSABLE) ×2 IMPLANT
TRAY FAXITRON CT DISP (TRAY / TRAY PROCEDURE) ×2 IMPLANT
TUBE CONNECTING 20X1/4 (TUBING) IMPLANT
YANKAUER SUCT BULB TIP NO VENT (SUCTIONS) IMPLANT

## 2020-09-05 NOTE — Anesthesia Procedure Notes (Signed)
Procedure Name: LMA Insertion Date/Time: 09/05/2020 10:35 AM Performed by: Ezequiel Kayser, CRNA Pre-anesthesia Checklist: Patient identified, Emergency Drugs available, Suction available and Patient being monitored Patient Re-evaluated:Patient Re-evaluated prior to induction Oxygen Delivery Method: Circle System Utilized Preoxygenation: Pre-oxygenation with 100% oxygen Induction Type: IV induction Ventilation: Mask ventilation without difficulty LMA: LMA inserted LMA Size: 4.0 Number of attempts: 1 Airway Equipment and Method: Bite block Placement Confirmation: positive ETCO2 Tube secured with: Tape Dental Injury: Teeth and Oropharynx as per pre-operative assessment  Comments: Eyes taped prior to insertion

## 2020-09-05 NOTE — Interval H&P Note (Signed)
History and Physical Interval Note:  09/05/2020 10:09 AM  [name]  has presented today for surgery, with the diagnosis of LEFT BREAST COMPLEX SCLEROSING LESION.  The various methods of treatment have been discussed with the patient and family. After consideration of risks, benefits and other options for treatment, the patient has consented to  Procedure(s): LEFT BREAST LUMPECTOMY WITH RADIOACTIVE SEED LOCALIZATION (Left) as a surgical intervention.  The patient's history has been reviewed, patient examined, no change in status, stable for surgery.  I have reviewed the patient's chart and labs.  Questions were answered to the patient's satisfaction.     [name]

## 2020-09-05 NOTE — Transfer of Care (Signed)
Immediate Anesthesia Transfer of Care Note  Patient: [name]  Procedure(s) Performed: LEFT BREAST LUMPECTOMY WITH RADIOACTIVE SEED LOCALIZATION (Left Breast)  Patient Location: PACU  Anesthesia Type:General  Level of Consciousness: awake  Airway & Oxygen Therapy: Patient Spontanous Breathing and Patient connected to face mask oxygen  Post-op Assessment: Report given to RN and Post -op Vital signs reviewed and stable  Post vital signs: Reviewed and stable  Last Vitals:  Vitals Value Taken Time  BP 102/64 09/05/20 1122  Temp    Pulse 90 09/05/20 1123  Resp 14 09/05/20 1123  SpO2 100 % 09/05/20 1123  Vitals shown include unvalidated device data.  Last Pain:  Vitals:   09/05/20 0901  TempSrc: Oral  PainSc: 0-No pain      Patients Stated Pain Goal: 6 (26/66/64 8616)  Complications: No complications documented.

## 2020-09-05 NOTE — Op Note (Signed)
09/05/2020  11:16 AM  PATIENT:  [name]  55 y.o. female  PRE-OPERATIVE DIAGNOSIS:  LEFT BREAST COMPLEX SCLEROSING LESION  POST-OPERATIVE DIAGNOSIS:  LEFT BREAST COMPLEX SCLEROSING LESION  PROCEDURE:  Procedure(s): LEFT BREAST LUMPECTOMY WITH RADIOACTIVE SEED LOCALIZATION (Left)  SURGEON:  Surgeon(s) and Role:    Jovita Kussmaul, MD - Primary  PHYSICIAN ASSISTANT:   ASSISTANTS: none   ANESTHESIA:   local and general  EBL:  1 mL   BLOOD ADMINISTERED:none  DRAINS: none   LOCAL MEDICATIONS USED:  MARCAINE     SPECIMEN:  Source of Specimen:  left breast tissue  DISPOSITION OF SPECIMEN:  PATHOLOGY  COUNTS:  YES  TOURNIQUET:  * No tourniquets in log *  DICTATION: .Dragon Dictation   After informed consent was obtained the patient was brought to the operating room and placed in the supine position on the operating table.  After adequate induction of general anesthesia the patient's left breast was prepped with ChloraPrep, allowed to dry, and draped in usual sterile manner.  An appropriate timeout was performed.  Previously an I-125 seed was placed in the upper inner quadrant of the central left breast to mark an area of complex sclerosing lesion.  The neoprobe was set to I-125 in the area of radioactivity was readily identified.  The area around this was infiltrated with quarter percent Marcaine.  A curvilinear incision was made with a fifteen blade knife along the upper inner edge of the areola of the left breast.  The incision was carried through the skin and subcutaneous tissue sharply with the electrocautery.  Dissection was then carried towards the radioactive seed under the direction of the neoprobe.  Once I more closely approached the radioactive seed I then removed a circular portion of breast tissue sharply with the electrocautery around the radioactive seed while checking the area of radioactivity frequently.  Once the specimen was removed it was oriented with the  appropriate paint colors.  A specimen radiograph was obtained that showed the clip and seed to be within the specimen.  The specimen was then sent to pathology for further evaluation.  Hemostasis was achieved using the Bovie electrocautery.  The wound was irrigated with saline and infiltrated with quarter percent Marcaine.  The deep layer of the wound was then closed with interrupted 3-0 Vicryl stitches.  The skin was then closed with interrupted 4-0 Monocryl subcuticular stitches.  Dermabond dressings were applied.  The patient tolerated the procedure well.  At the end of the case all needle sponge and instrument counts were correct.  The patient was then awakened and taken to recovery in stable condition.  PLAN OF CARE: Discharge to home after PACU  PATIENT DISPOSITION:  PACU - hemodynamically stable.   Delay start of Pharmacological VTE agent (>24hrs) due to surgical blood loss or risk of bleeding: not applicable

## 2020-09-05 NOTE — H&P (Signed)
[name]  Location: [facility] Patient #: [id number] DOB: [birth year] Married / Language: English / Race: White Female   History of Present Illness  The patient is a 55 year old female who presents with a breast mass. We are asked to see the patient in consultation by Dr. Ronita Hipps to evaluate her for a complex sclerosing lesion of the left breast. The patient is a 55 year old white female who recently went for a routine screening mammogram. At that time she was found to have an asymmetry in the upper inner quadrant of the left breast that was abnormal. This was biopsied and came back as a complex sclerosing lesion. She denies any breast pain or discharge from the nipple. She has no family history of breast cancer. She does not smoke.   Past Surgical History  Anal Fissure Repair  Breast Biopsy  Left. Colon Polyp Removal - Colonoscopy  Hysterectomy (not due to cancer) - Complete  Open Inguinal Hernia Surgery  Bilateral. Oral Surgery   Diagnostic Studies History  Colonoscopy  5-10 years ago Mammogram  within last year Pap Smear  >5 years ago  Allergies  Sulfacetamide *CHEMICALS*  Allergies Reconciled   Medication History  Balsalazide Disodium (750MG Capsule, Oral) Active. Gabapentin (300MG Capsule, Oral) Active. Mercaptopurine (50MG Tablet, Oral) Active. Calcium (Oral) Specific strength unknown - Active. Multivitamin (Oral) Active. Medications Reconciled  Social History  Alcohol use  Occasional alcohol use. Caffeine use  Coffee, Tea. No drug use  Tobacco use  Never smoker.  Family History  Alcohol Abuse  Father. Arthritis  Mother. Heart Disease  Family Members In General. Melanoma  Family Members In General. Respiratory Condition  Mother.  Pregnancy / Birth History  Age at menarche  81 years. Age of menopause  51-55 Contraceptive History  Oral contraceptives. Gravida  2 Length (months) of breastfeeding   3-6 Maternal age  71-30 Para  2  Other Problems  Back Pain  Crohn's Disease  Gastroesophageal Reflux Disease  Hemorrhoids  Inguinal Hernia  Other disease, cancer, significant illness     Review of Systems  General Not Present- Appetite Loss, Chills, Fatigue, Fever, Night Sweats, Weight Gain and Weight Loss. Skin Not Present- Change in Wart/Mole, Dryness, Hives, Jaundice, New Lesions, Non-Healing Wounds, Rash and Ulcer. HEENT Present- Wears glasses/contact lenses. Not Present- Earache, Hearing Loss, Hoarseness, Nose Bleed, Oral Ulcers, Ringing in the Ears, Seasonal Allergies, Sinus Pain, Sore Throat, Visual Disturbances and Yellow Eyes. Respiratory Not Present- Bloody sputum, Chronic Cough, Difficulty Breathing, Snoring and Wheezing. Breast Not Present- Breast Mass, Breast Pain, Nipple Discharge and Skin Changes. Cardiovascular Not Present- Chest Pain, Difficulty Breathing Lying Down, Leg Cramps, Palpitations, Rapid Heart Rate, Shortness of Breath and Swelling of Extremities. Gastrointestinal Not Present- Abdominal Pain, Bloating, Bloody Stool, Change in Bowel Habits, Chronic diarrhea, Constipation, Difficulty Swallowing, Excessive gas, Gets full quickly at meals, Hemorrhoids, Indigestion, Nausea, Rectal Pain and Vomiting. Female Genitourinary Not Present- Frequency, Nocturia, Painful Urination, Pelvic Pain and Urgency. Musculoskeletal Not Present- Back Pain, Joint Pain, Joint Stiffness, Muscle Pain, Muscle Weakness and Swelling of Extremities. Neurological Not Present- Decreased Memory, Fainting, Headaches, Numbness, Seizures, Tingling, Tremor, Trouble walking and Weakness. Psychiatric Not Present- Anxiety, Bipolar, Change in Sleep Pattern, Depression, Fearful and Frequent crying. Endocrine Not Present- Cold Intolerance, Excessive Hunger, Hair Changes, Heat Intolerance, Hot flashes and New Diabetes. Hematology Not Present- Blood Thinners, Easy Bruising, Excessive bleeding, Gland  problems, HIV and Persistent Infections.  Vitals  Weight: 220 lb Temp.: 56F  Pulse: 87 (Regular)  P.OX:  98% (Room air) BP: 106/74(Sitting, Left Arm, Standard)       Physical Exam General Mental Status-Alert. General Appearance-Consistent with stated age. Hydration-Well hydrated. Voice-Normal.  Head and Neck Head-normocephalic, atraumatic with no lesions or palpable masses. Trachea-midline. Thyroid Gland Characteristics - normal size and consistency.  Eye Eyeball - Bilateral-Extraocular movements intact. Sclera/Conjunctiva - Bilateral-No scleral icterus.  Chest and Lung Exam Chest and lung exam reveals -quiet, even and easy respiratory effort with no use of accessory muscles and on auscultation, normal breath sounds, no adventitious sounds and normal vocal resonance. Inspection Chest Wall - Normal. Back - normal.  Breast Note: There is no palpable mass in either breast. There is no palpable axillary, supraclavicular, or cervical lymphadenopathy   Cardiovascular Cardiovascular examination reveals -normal heart sounds, regular rate and rhythm with no murmurs and normal pedal pulses bilaterally.  Abdomen Inspection Inspection of the abdomen reveals - No Hernias. Skin - Scar - no surgical scars. Palpation/Percussion Palpation and Percussion of the abdomen reveal - Soft, Non Tender, No Rebound tenderness, No Rigidity (guarding) and No hepatosplenomegaly. Auscultation Auscultation of the abdomen reveals - Bowel sounds normal.  Neurologic Neurologic evaluation reveals -alert and oriented x 3 with no impairment of recent or remote memory. Mental Status-Normal.  Musculoskeletal Normal Exam - Left-Upper Extremity Strength Normal and Lower Extremity Strength Normal. Normal Exam - Right-Upper Extremity Strength Normal and Lower Extremity Strength Normal.  Lymphatic Head & Neck  General Head & Neck Lymphatics: Bilateral - Description  - Normal. Axillary  General Axillary Region: Bilateral - Description - Normal. Tenderness - Non Tender. Femoral & Inguinal  Generalized Femoral & Inguinal Lymphatics: Bilateral - Description - Normal. Tenderness - Non Tender.    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 The cause of its abnormal appearance and because there is a 5-10% chance of missing something more significant the recommendation is to have this area removed. She would also like to have this done. I have discussed with her in detail the risks and benefits of the operation as well as some of the technical aspects including the use of a radioactive seed for localization and she understands and wishes to proceed. This patient encounter took 30 minutes today to perform the following: take history, perform exam, review outside records, interpret imaging, counsel the patient on their diagnosis and document encounter, findings & plan in the EHR

## 2020-09-05 NOTE — Discharge Instructions (Signed)
1049m of tylenol given at 9:00 am 09/05/20   Post Anesthesia Home Care Instructions  Activity: Get plenty of rest for the remainder of the day. A responsible individual must stay with you for 24 hours following the procedure.  For the next 24 hours, DO NOT: -Drive a car -OPaediatric nurse-Drink alcoholic beverages -Take any medication unless instructed by your physician -Make any legal decisions or sign important papers.  Meals: Start with liquid foods such as gelatin or soup. Progress to regular foods as tolerated. Avoid greasy, spicy, heavy foods. If nausea and/or vomiting occur, drink only clear liquids until the nausea and/or vomiting subsides. Call your physician if vomiting continues.  Special Instructions/Symptoms: Your throat may feel dry or sore from the anesthesia or the breathing tube placed in your throat during surgery. If this causes discomfort, gargle with warm salt water. The discomfort should disappear within 24 hours.  If you had a scopolamine patch placed behind your ear for the management of post- operative nausea and/or vomiting:  1. The medication in the patch is effective for 72 hours, after which it should be removed.  Wrap patch in a tissue and discard in the trash. Wash hands thoroughly with soap and water. 2. You may remove the patch earlier than 72 hours if you experience unpleasant side effects which may include dry mouth, dizziness or visual disturbances. 3. Avoid touching the patch. Wash your hands with soap and water after contact with the patch.

## 2020-09-05 NOTE — Anesthesia Postprocedure Evaluation (Signed)
Anesthesia Post Note  Patient: [name]  Procedure(s) Performed: LEFT BREAST LUMPECTOMY WITH RADIOACTIVE SEED LOCALIZATION (Left Breast)     Patient location during evaluation: PACU Anesthesia Type: General Level of consciousness: awake and alert Pain management: pain level controlled Vital Signs Assessment: post-procedure vital signs reviewed and stable Respiratory status: spontaneous breathing, nonlabored ventilation, respiratory function stable and patient connected to nasal cannula oxygen Cardiovascular status: blood pressure returned to baseline and stable Postop Assessment: no apparent nausea or vomiting Anesthetic complications: no   No complications documented.  Last Vitals:  Vitals:   09/05/20 1145 09/05/20 1218  BP: 109/60 115/68  Pulse: 77 67  Resp: 17 18  Temp:  36.5 C  SpO2: 97% 100%    Last Pain:  Vitals:   09/05/20 1209  TempSrc:   PainSc: 0-No pain                 Ferrell Flam L Britania Shreeve

## 2020-09-05 NOTE — Anesthesia Preprocedure Evaluation (Signed)
Anesthesia Evaluation  Patient identified by MRN, date of birth, ID band Patient awake    Reviewed: Allergy & Precautions, NPO status , Patient's Chart, lab work & pertinent test results  Airway Mallampati: II  TM Distance: >3 FB Neck ROM: Full    Dental no notable dental hx.    Pulmonary neg pulmonary ROS,    Pulmonary exam normal breath sounds clear to auscultation       Cardiovascular negative cardio ROS Normal cardiovascular exam Rhythm:Regular Rate:Normal     Neuro/Psych negative neurological ROS  negative psych ROS   GI/Hepatic Neg liver ROS, H/o crohn's   Endo/Other  negative endocrine ROS  Renal/GU negative Renal ROS  negative genitourinary   Musculoskeletal  (+) Arthritis ,   Abdominal   Peds  Hematology negative hematology ROS (+)   Anesthesia Other Findings   Reproductive/Obstetrics                            Anesthesia Physical Anesthesia Plan  ASA: II  Anesthesia Plan: General   Post-op Pain Management:    Induction: Intravenous  PONV Risk Score and Plan: 3 and Ondansetron, Dexamethasone and Midazolam  Airway Management Planned: LMA  Additional Equipment:   Intra-op Plan:   Post-operative Plan: Extubation in OR  Informed Consent: I have reviewed the patients History and Physical, chart, labs and discussed the procedure including the risks, benefits and alternatives for the proposed anesthesia with the patient or authorized representative who has indicated his/her understanding and acceptance.     Dental advisory given  Plan Discussed with: CRNA  Anesthesia Plan Comments:         Anesthesia Quick Evaluation

## 2020-09-08 ENCOUNTER — Encounter (HOSPITAL_BASED_OUTPATIENT_CLINIC_OR_DEPARTMENT_OTHER): Payer: Self-pay | Admitting: General Surgery

## 2020-09-08 LAB — SURGICAL PATHOLOGY

## 2020-09-29 ENCOUNTER — Encounter: Payer: Self-pay | Admitting: Gastroenterology

## 2020-09-29 ENCOUNTER — Other Ambulatory Visit: Payer: Self-pay

## 2020-09-29 ENCOUNTER — Ambulatory Visit (INDEPENDENT_AMBULATORY_CARE_PROVIDER_SITE_OTHER): Payer: 59 | Admitting: Gastroenterology

## 2020-09-29 VITALS — BP 96/67 | HR 74 | Ht 64.0 in | Wt 165.4 lb

## 2020-09-29 DIAGNOSIS — K5010 Crohn's disease of large intestine without complications: Secondary | ICD-10-CM | POA: Diagnosis not present

## 2020-09-29 DIAGNOSIS — M8580 Other specified disorders of bone density and structure, unspecified site: Secondary | ICD-10-CM | POA: Diagnosis not present

## 2020-09-29 DIAGNOSIS — Z23 Encounter for immunization: Secondary | ICD-10-CM | POA: Diagnosis not present

## 2020-09-29 DIAGNOSIS — D509 Iron deficiency anemia, unspecified: Secondary | ICD-10-CM | POA: Diagnosis not present

## 2020-09-29 NOTE — Addendum Note (Signed)
Addended by: Dorethea Clan on: 09/29/2020 03:30 PM   Modules accepted: Orders

## 2020-09-29 NOTE — Patient Instructions (Signed)
Follow up in 4 weeks for second dose of Hepatitis A/B vaccine.

## 2020-09-29 NOTE — Progress Notes (Signed)
Jonathon Bellows MD, MRCP(U.K) [address]  [place]  [place], [place] [address]  Main: [phone]  Fax: [phone]   Primary Care Physician: Marval Regal, NP  Primary Gastroenterologist:  Dr. Jonathon Bellows   Crohn's disease follow-up  HPI: [name] is a 55 y.o. female    Summary of history :  She transferred care to our office in November 2021 for Crohn's disease.  Previously seen by [facility] gastroenterology. She states that she was diagnosed with Crohn's disease at the age of 66 and since then has been on mesalamine and 6-MP for many years Prior records suggest she had Crohn's disease of the right colon since 55 years of age.  She has been taking Celebrex 1 tablet daily for many years, had been on balsalazide with symptoms.  She recollect she also had prior inflammation of the terminal ileum.  Commenced on oral iron a few weeks prior to her initial visit.  Taken Remicade in the past but could not get IV access and stopped.  Had been up-to-date with her skin exams.  No recent abdominal imaging.  06/12/2020: CRP 1.0 hemoglobin 11.3 g with an MCV of 85 TSH of 1.55 CMP normal HIV negative vitamin D low at 16.7 B12 and folate normal.  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  Aphthous ulceration noted in the transverse colon splenic flexure ascending colon.  Mucosal pattern in the transverse and ascending colon including cecum was decreased diffusely.  Diverticulosis noted of the sigmoid colon.Path report demonstrated chronic colitis of the cecum with moderate activity in the ascending colon, transverse colon, descending colon.  Proctitis was also noted in the rectum.  07/23/2020: MR angiogram with and without contrast showed narrowing of the terminal ileum involving approximately 6 cm.  06/30/2020: Hepatitis serologies suggested that she  is not immune to hepatitis a and B and need for immunization. 08/07/2020 bone density scan features of osteopenia noted.  Denies any symptoms doing well.  Has stopped taking Celebrex.  On iron tablets daily.  No other complaints. Current Outpatient Medications  Medication Sig Dispense Refill  . balsalazide (COLAZAL) 750 MG capsule TAKE 3 CAPSULES (2,250 MG TOTAL) BY MOUTH 3 (THREE) TIMES DAILY.    Marland Kitchen CALCIUM-VITAMIN D PO Take 1 tablet by mouth daily.    . Ferrous Sulfate (IRON PO) Take 27 mg by mouth daily.    Marland Kitchen gabapentin (NEURONTIN) 300 MG capsule TAKE 1 CAPSULE BY MOUTH TWICE A DAY    . mercaptopurine (PURINETHOL) 50 MG tablet Take 50 mg by mouth daily. Give on an empty stomach 1 hour before or 2 hours after meals. Caution: Chemotherapy.    . Multiple Vitamin (MULTI-VITAMIN) tablet Take by mouth.    Marland Kitchen HYDROcodone-acetaminophen (NORCO/VICODIN) 5-325 MG tablet Take 1-2 tablets by mouth every 6 (six) hours as needed for moderate pain or severe pain. (Patient not taking: Reported on 09/29/2020) 10 tablet 0   No current facility-administered medications for this visit.    Allergies as of 09/29/2020 - Review Complete 09/29/2020  Allergen Reaction Noted  . Sulfa antibiotics Anaphylaxis and Swelling 11/11/2012    ROS:  General: Negative for anorexia, weight loss, fever, chills, fatigue, weakness. ENT: Negative for hoarseness, difficulty swallowing , nasal congestion. CV: Negative for chest pain, angina, palpitations, dyspnea on exertion, peripheral edema.  Respiratory: Negative for dyspnea at rest, dyspnea on exertion, cough,  sputum, wheezing.  GI: See history of present illness. GU:  Negative for dysuria, hematuria, urinary incontinence, urinary frequency, nocturnal urination.  Endo: Negative for unusual weight change.    Physical Examination:   BP 96/67 (BP Location: Left Arm, Patient Position: Sitting, Cuff Size: Large)   Pulse 74   Ht 5' 4"  (1.626 m)   Wt 165 lb 6.4 oz (75 kg)    LMP 09/15/2012   BMI 28.39 kg/m   General: Well-nourished, well-developed in no acute distress.  Eyes: No icterus. Conjunctivae pink. Neuro: Alert and oriented x 3.  Grossly intact. Skin: Warm and dry, no jaundice.   Psych: Alert and cooperative, normal mood and affect.   Imaging Studies: MM Breast Surgical Specimen  Result Date: 09/05/2020 CLINICAL DATA:  Surgical excision of a recently biopsied left breast complex sclerosing lesion with calcifications and usual ductal hyperplasia. EXAM: SPECIMEN RADIOGRAPH OF THE LEFT BREAST COMPARISON:  Previous exam(s). FINDINGS: Status post excision of the left breast. The radioactive seed and ribbon shaped biopsy marker clip are present, completely intact. IMPRESSION: Specimen radiograph of the left breast. Electronically Signed   By: Claudie Revering M.D.   On: 09/05/2020 11:08   MM LT RADIOACTIVE SEED LOC MAMMO GUIDE  Result Date: 09/04/2020 CLINICAL DATA:  Patient presents for seed localization prior to LEFT lumpectomy. Patient had biopsy of the LEFT breast in October 2021 showing complex sclerosing lesion with calcifications and usual ductal hyperplasia. EXAM: MAMMOGRAPHIC GUIDED RADIOACTIVE SEED LOCALIZATION OF THE LEFT BREAST COMPARISON:  Previous exam(s). FINDINGS: Patient presents for radioactive seed localization prior to lumpectomy. I met with the patient and we discussed the procedure of seed localization including benefits and alternatives. We discussed the high likelihood of a successful procedure. We discussed the risks of the procedure including infection, bleeding, tissue injury and further surgery. We discussed the low dose of radioactivity involved in the procedure. Informed, written consent was given. The usual time-out protocol was performed immediately prior to the procedure. Using mammographic guidance, sterile technique, 1% lidocaine and an I-125 radioactive seed, the ribbon shaped clip in the MEDIAL portion of the LEFT breast was localized  using a MEDIAL to LATERAL approach. The follow-up mammogram images confirm the seed in the expected location and were marked for Dr. Marlou Starks. Follow-up survey of the patient confirms presence of the radioactive seed. Order number of I-125 seed:  [id number]. Total activity:  1.540 millicuries reference Date: 08/12/2020 The patient tolerated the procedure well and was released from the [place]. She was given instructions regarding seed removal. IMPRESSION: Radioactive seed localization left breast. No apparent complications. Electronically Signed   By: Nolon Nations M.D.   On: 09/04/2020 13:41    Assessment and Plan:   [name] is a 55 y.o. y/o female is here to follow-up for Crohn's disease.  Transferred care to our practice in November 2021 after being treated by [facility] gastroenterology for many years.  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  Has been on a mesalamine compound.  In the past Remicade has been attempted but failed as she could not have IV access.  With active disease that is stricturing she would need to have escalation of therapy.  The obvious choice would be a biologic.  I would recommend using Stelara as it does not require IV access except for the first induction dose and could proceed with monotherapy.  Bone density scan showed osteopenia  Plan 1.  Refer to endocrinology to evaluate  for osteopenia in the setting of inflammatory bowel disease 2.  Check vitamin D levels, calcium, CRP, iron studies, will check varicella immunoglobulin as well. 3.  Hepatitis A and B vaccination 4.  Obtain authorization for either Stelara.  Discussed the risks versus benefits of starting her on a biological agent which would include but not limited to immunosuppression leading to increased risk of infection, allergic reaction, cellulitis, increased risk of malignancy.  Discussed not putting her on a biological agent could continue the process of  inflammation leading to worsening of her strictures which would require surgery and also increase the risk of colon cancer due to persistent inflammation 5.  Avoid NSAIDs 6.  We will perform a CT angiogram 4 to 5 months after starting a biological agent to look for improvement and may subsequently require a colonoscopy as well at that point of time.  Dr Jonathon Bellows  MD,MRCP [facility]) Follow up in 4 to 5 weeks

## 2020-09-30 ENCOUNTER — Other Ambulatory Visit: Payer: Self-pay

## 2020-09-30 ENCOUNTER — Ambulatory Visit (INDEPENDENT_AMBULATORY_CARE_PROVIDER_SITE_OTHER): Payer: 59 | Admitting: Dermatology

## 2020-09-30 ENCOUNTER — Encounter: Payer: Self-pay | Admitting: Dermatology

## 2020-09-30 DIAGNOSIS — L659 Nonscarring hair loss, unspecified: Secondary | ICD-10-CM | POA: Diagnosis not present

## 2020-09-30 DIAGNOSIS — L821 Other seborrheic keratosis: Secondary | ICD-10-CM | POA: Diagnosis not present

## 2020-09-30 DIAGNOSIS — L689 Hypertrichosis, unspecified: Secondary | ICD-10-CM

## 2020-09-30 DIAGNOSIS — L905 Scar conditions and fibrosis of skin: Secondary | ICD-10-CM

## 2020-09-30 DIAGNOSIS — M8580 Other specified disorders of bone density and structure, unspecified site: Secondary | ICD-10-CM

## 2020-09-30 DIAGNOSIS — L988 Other specified disorders of the skin and subcutaneous tissue: Secondary | ICD-10-CM | POA: Diagnosis not present

## 2020-09-30 DIAGNOSIS — L814 Other melanin hyperpigmentation: Secondary | ICD-10-CM

## 2020-09-30 MED ORDER — BIMATOPROST 0.03 % EX SOLN: 1.0000 "application " | Freq: Every day | CUTANEOUS | 12 refills | Status: DC

## 2020-09-30 NOTE — Progress Notes (Signed)
   Follow-Up Visit   Subjective  [name] is a 55 y.o. female who presents for the following: Follow-up (Patient here today for 3 month ISK follow up at left forearm. Treated with LN2 at last visit. She would also like to discuss Botox and Halo. ).  Patient starting Stelara in a few weeks for Crohn's.   The following portions of the chart were reviewed this encounter and updated as appropriate:   Tobacco  Allergies  Meds  Problems  Med Hx  Surg Hx  Fam Hx      Review of Systems:  No other skin or systemic complaints except as noted in HPI or Assessment and Plan.  Objective  Well appearing patient in no apparent distress; mood and affect are within normal limits.  A focused examination was performed including face, left forearm. Relevant physical exam findings are noted in the Assessment and Plan.  Objective  face: Rhytides and volume loss.    Assessment & Plan  Elastosis of skin face  Patient would like to do Botox for a wedding in May.  Discussed Botox and recommend she do injections towards end of end of March/early April.  Patient advises she has had chemical peels prior and did not get good results. Consider treating with a light freeze or doing a peel with a booster for macular seborrheic keratoses.  Consider Halo laser with Lind Covert.   Recommend patient treat with Valtrex prior to peel or halo treatments. Consider adding a doxycycline preventative for 5 days 100 mg BID prior to peel or halo since she has a history of superinfection and also will be starting Stelara in next few weeks.  Alopecia  Ordered Medications: bimatoprost (LATISSE) 0.03 % ophthalmic solution  Hypotrichosis eyelashes  Start bimatoprost ophthalmic (latisse) nightly  Reviewed risk of darkening of iris, hyperpigmented skin, skin irritation, loss of periorbital fat  Seborrheic Keratoses - Stuck-on, waxy, tan-brown papules and plaques  - Discussed benign etiology and  prognosis. - Observe - Call for any changes  Lentigines - Scattered tan macules - Discussed due to sun exposure - Benign, observe - Recommend daily broad spectrum sunscreen SPF 30+ to sun-exposed areas, reapply every 2 hours as needed. - Call for any changes  Scar left forearm - Recommend Serica moisturizing scar formula or [facility] silicone scar sheets - Recommend daily broad spectrum sunscreen SPF 30+ to sun-exposed areas, reapply every 2 hours as needed. Call for new or changing lesions.  Return in about 5 weeks (around 11/04/2020) for Botox.  Graciella Belton, RMA, am acting as scribe for Forest Gleason, MD .  Documentation: I have reviewed the above documentation for accuracy and completeness, and I agree with the above.  Forest Gleason, MD

## 2020-09-30 NOTE — Patient Instructions (Signed)
Recommend Serica scar gel or [facility] scar sheets

## 2020-10-07 ENCOUNTER — Encounter: Payer: Self-pay | Admitting: Gastroenterology

## 2020-10-07 LAB — CBC WITH DIFFERENTIAL/PLATELET
Basophils Absolute: 0.1 10*3/uL (ref 0.0–0.2)
Basos: 1 %
EOS (ABSOLUTE): 0.2 10*3/uL (ref 0.0–0.4)
Eos: 2 %
Hematocrit: 40.4 % (ref 34.0–46.6)
Hemoglobin: 13.4 g/dL (ref 11.1–15.9)
Immature Grans (Abs): 0 10*3/uL (ref 0.0–0.1)
Immature Granulocytes: 0 %
Lymphocytes Absolute: 1.2 10*3/uL (ref 0.7–3.1)
Lymphs: 16 %
MCH: 29.5 pg (ref 26.6–33.0)
MCHC: 33.2 g/dL (ref 31.5–35.7)
MCV: 89 fL (ref 79–97)
Monocytes Absolute: 0.8 10*3/uL (ref 0.1–0.9)
Monocytes: 11 %
Neutrophils Absolute: 5.1 10*3/uL (ref 1.4–7.0)
Neutrophils: 70 %
Platelets: 241 10*3/uL (ref 150–450)
RBC: 4.55 x10E6/uL (ref 3.77–5.28)
RDW: 14.5 % (ref 11.7–15.4)
WBC: 7.2 10*3/uL (ref 3.4–10.8)

## 2020-10-07 LAB — VITAMIN D 1,25 DIHYDROXY
Vitamin D 1, 25 (OH)2 Total: 54 pg/mL
Vitamin D2 1, 25 (OH)2: <10 pg/mL
Vitamin D3 1, 25 (OH)2: 54 pg/mL

## 2020-10-07 LAB — IRON,TIBC AND FERRITIN PANEL
Ferritin: 30 ng/mL (ref 15–150)
Iron Saturation: 18 % (ref 15–55)
Iron: 57 ug/dL (ref 27–159)
Total Iron Binding Capacity: 311 ug/dL (ref 250–450)
UIBC: 254 ug/dL (ref 131–425)

## 2020-10-07 LAB — VARICELLA ZOSTER ANTIBODY, IGG: Varicella zoster IgG: 1803 index (ref >165)

## 2020-10-20 ENCOUNTER — Telehealth: Payer: Self-pay

## 2020-10-20 NOTE — Telephone Encounter (Signed)
-----   Message from Jonathon Bellows, MD sent at 10/16/2020  3:23 PM EST ----- All labs are normal.  She does not need a varicella vaccine and vitamin D levels are within normal limits.

## 2020-10-27 ENCOUNTER — Encounter (INDEPENDENT_AMBULATORY_CARE_PROVIDER_SITE_OTHER): Payer: 59 | Admitting: Gastroenterology

## 2020-10-27 ENCOUNTER — Other Ambulatory Visit: Payer: Self-pay

## 2020-10-27 DIAGNOSIS — Z23 Encounter for immunization: Secondary | ICD-10-CM | POA: Diagnosis not present

## 2020-10-30 ENCOUNTER — Telehealth: Payer: Self-pay | Admitting: Nurse Practitioner

## 2020-10-30 NOTE — Telephone Encounter (Signed)
Called and advised patient she is not due a Pneumonia vaccine until November of 2022 and that Dr. Nicki Reaper is not excepting new patients at this time, that we have a new NP coming that she is welcome to establish patient refused at this time.

## 2020-10-30 NOTE — Telephone Encounter (Signed)
Pt wanted to know when she is due to get her next Pneumococcal Conjugate-13 Pt didn't want to make a TOC appt

## 2020-11-05 ENCOUNTER — Other Ambulatory Visit: Payer: Self-pay

## 2020-11-05 ENCOUNTER — Ambulatory Visit (INDEPENDENT_AMBULATORY_CARE_PROVIDER_SITE_OTHER): Payer: Self-pay | Admitting: Dermatology

## 2020-11-05 ENCOUNTER — Encounter: Payer: Self-pay | Admitting: Dermatology

## 2020-11-05 DIAGNOSIS — L988 Other specified disorders of the skin and subcutaneous tissue: Secondary | ICD-10-CM

## 2020-11-05 NOTE — Patient Instructions (Signed)

## 2020-11-05 NOTE — Progress Notes (Signed)
   Follow-Up Visit   Subjective  [name] is a 55 y.o. female who presents for the following: Facial Elastosis (Patient is here today for botox. Patient here today for botox at brows. ).  The following portions of the chart were reviewed this encounter and updated as appropriate:  Tobacco  Allergies  Meds  Problems  Med Hx  Surg Hx  Fam Hx       Objective  Well appearing patient in no apparent distress; mood and affect are within normal limits.  A focused examination was performed including face. Relevant physical exam findings are noted in the Assessment and Plan.    Objective  face: Rhytides and volume loss.   Images          Assessment & Plan  Elastosis of skin face  Glabellar complex Forehead Brow lift (2 sites each side)  Botox Injection - face Location: See attached image  Informed consent: Discussed risks (infection, pain, bleeding, bruising, swelling, allergic reaction, paralysis of nearby muscles, eyelid droop, double vision, neck weakness, difficulty breathing, headache, undesirable cosmetic result, and need for additional treatment) and benefits of the procedure, as well as the alternatives.  Informed consent was obtained.  Preparation: The area was cleansed with alcohol.  Procedure Details:  Botox was injected into the dermis with a 30-gauge needle. Pressure applied to any bleeding. Ice packs offered for swelling.  Lot Number:  [id number] Expiration:  12/2022  Total Units Injected:  34  Plan: Patient was instructed to remain upright for 4 hours. Patient was instructed to avoid massaging the face and avoid vigorous exercise for the rest of the day. Tylenol may be used for headache.  Allow 2 weeks before returning to clinic for additional dosing as needed. Patient will call for any problems.   Return in about 2 weeks (around 11/19/2020) for recheck Botox.  I, Ruthell Rummage, CMA, am acting as scribe for Forest Gleason, MD.  Documentation: I  have reviewed the above documentation for accuracy and completeness, and I agree with the above.  Forest Gleason, MD

## 2020-11-10 NOTE — Telephone Encounter (Signed)
Can stop Balsalazide after she starts the [place], can continue rest   [name]

## 2020-11-10 NOTE — Telephone Encounter (Signed)
Please Advise

## 2020-11-19 ENCOUNTER — Encounter: Payer: Self-pay | Admitting: Dermatology

## 2020-11-19 ENCOUNTER — Other Ambulatory Visit: Payer: Self-pay

## 2020-11-19 ENCOUNTER — Ambulatory Visit (INDEPENDENT_AMBULATORY_CARE_PROVIDER_SITE_OTHER): Payer: Self-pay | Admitting: Dermatology

## 2020-11-19 DIAGNOSIS — L988 Other specified disorders of the skin and subcutaneous tissue: Secondary | ICD-10-CM

## 2020-11-19 NOTE — Patient Instructions (Addendum)
Recommend taking Heliocare sun protection supplement daily in sunny weather for additional sun protection. For maximum protection on the sunniest days, you can take up to 2 capsules of regular Heliocare OR take 1 capsule of Heliocare Ultra. For prolonged exposure (such as a full day in the sun), you can repeat your dose of the supplement 4 hours after your first dose. Heliocare can be purchased at [facility] or at [email or web address].com.    If you have any questions or concerns for your doctor, please call our main line at [phone] and press option 4 to reach your doctor's medical assistant. If no one answers, please leave a voicemail as directed and we will return your call as soon as possible. Messages left after 4 pm will be answered the following business day.   You may also send us a message via MyChart. We typically respond to MyChart messages within 1-2 business days.  For prescription refills, please ask your pharmacy to contact our office. Our fax number is [phone].  If you have an urgent issue when the clinic is closed that cannot wait until the next business day, you can page your doctor at the number below.    Please note that while we do our best to be available for urgent issues outside of office hours, we are not available 24/7.   If you have an urgent issue and are unable to reach us, you may choose to seek medical care at your doctor's office, retail clinic, urgent care center, or emergency room.  If you have a medical emergency, please immediately call 911 or go to the emergency department.  Pager Numbers  - Dr. Kowalski: [phone]  - Dr. Moye: [phone]  - Dr. Stewart: [phone]  In the event of inclement weather, please call our main line at [phone] for an update on the status of any delays or closures.  Dermatology Medication Tips: Please keep the boxes that topical medications come in in order to help keep track of the instructions about  where and how to use these. Pharmacies typically print the medication instructions only on the boxes and not directly on the medication tubes.   If your medication is too expensive, please contact our office at [phone] option 4 or send us a message through MyChart.   We are unable to tell what your co-pay for medications will be in advance as this is different depending on your insurance coverage. However, we may be able to find a substitute medication at lower cost or fill out paperwork to get insurance to cover a needed medication.   If a prior authorization is required to get your medication covered by your insurance company, please allow us 1-2 business days to complete this process.  Drug prices often vary depending on where the prescription is filled and some pharmacies may offer cheaper prices.  The website www.goodrx.com contains coupons for medications through different pharmacies. The prices here do not account for what the cost may be with help from insurance (it may be cheaper with your insurance), but the website can give you the price if you did not use any insurance.  - You can print the associated coupon and take it with your prescription to the pharmacy.  - You may also stop by our office during regular business hours and pick up a GoodRx coupon card.  - If you need your prescription sent electronically to a different pharmacy, notify our office through [facility] MyChart or by phone at [phone] option   4.  

## 2020-11-19 NOTE — Progress Notes (Signed)
   Follow-Up Visit   Subjective  [name] is a 55 y.o. female who presents for the following: Follow-up (Patient here today for 2 week Botox follow up. She is pleased with results but still has a little movement when she frowns and is wondering if she could add a little. ).  The following portions of the chart were reviewed this encounter and updated as appropriate:   Tobacco  Allergies  Meds  Problems  Med Hx  Surg Hx  Fam Hx      Review of Systems:  No other skin or systemic complaints except as noted in HPI or Assessment and Plan.  Objective  Well appearing patient in no apparent distress; mood and affect are within normal limits.  A focused examination was performed including face. Relevant physical exam findings are noted in the Assessment and Plan.  Objective  Face: Rhytides and volume loss.   Images     Assessment & Plan  Elastosis of skin Face  Botox Injection - Face Location: See attached image  Informed consent: Discussed risks (infection, pain, bleeding, bruising, swelling, allergic reaction, paralysis of nearby muscles, eyelid droop, double vision, neck weakness, difficulty breathing, headache, undesirable cosmetic result, and need for additional treatment) and benefits of the procedure, as well as the alternatives.  Informed consent was obtained.  Preparation: The area was cleansed with alcohol.  Procedure Details:  Botox was injected into the dermis with a 30-gauge needle. Pressure applied to any bleeding. Ice packs offered for swelling.  Lot Number:  [id number] Expiration:  12/2022  Total Units Injected:  6  Plan: Patient was instructed to remain upright for 4 hours. Patient was instructed to avoid massaging the face and avoid vigorous exercise for the rest of the day. Tylenol may be used for headache.  Allow 2 weeks before returning to clinic for additional dosing as needed. Patient will call for any problems.   Return in about 2 weeks (around  12/03/2020) for Botox follow up.  Graciella Belton, RMA, am acting as scribe for Forest Gleason, MD .  Documentation: I have reviewed the above documentation for accuracy and completeness, and I agree with the above.  Forest Gleason, MD

## 2020-11-20 ENCOUNTER — Ambulatory Visit (INDEPENDENT_AMBULATORY_CARE_PROVIDER_SITE_OTHER): Payer: 59 | Admitting: Gastroenterology

## 2020-11-20 ENCOUNTER — Ambulatory Visit: Payer: 59 | Admitting: Endocrinology

## 2020-11-20 ENCOUNTER — Encounter: Payer: Self-pay | Admitting: Gastroenterology

## 2020-11-20 VITALS — BP 101/66 | HR 71 | Ht 64.0 in | Wt 164.8 lb

## 2020-11-20 DIAGNOSIS — K5010 Crohn's disease of large intestine without complications: Secondary | ICD-10-CM

## 2020-11-20 NOTE — Progress Notes (Signed)
Jonathon Bellows MD, MRCP(U.K) [address]  [place]  [place], [place] [address]  Main: [phone]  Fax: [phone]   Primary Care Physician: Marval Regal, NP  Primary Gastroenterologist:  Dr. 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 female   Summary of history :  She transferred care to our office in November 2021 for Crohn's disease.  Previously seen by [facility] gastroenterology. She states that she was diagnosed with Crohn's disease at the age of 24 and since then has been on mesalamine and 6-MP for many years Prior records suggest she had Crohn's disease of the right colon since 55 years of age.  She has been taking Celebrex 1 tablet daily for many years, had been on balsalazide with symptoms.  She recollect she also had prior inflammation of the terminal ileum.  Commenced on oral iron a few weeks prior to her initial visit.  Taken Remicade in the past but could not get IV access and stopped.  Had been up-to-date with her skin exams.  No recent abdominal imaging.  06/12/2020: CRP 1.0 hemoglobin 11.3 g with an MCV of 85 TSH of 1.55 CMP normal HIV negative vitamin D low at 16.7 B12 and folate normal. 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  Aphthous ulceration noted in the transverse colon splenic flexure ascending colon.  Mucosal pattern in the transverse and ascending colon including cecum was decreased diffusely.  Diverticulosis noted of the sigmoid colon.Path report demonstrated chronic colitis of the cecum with moderate activity in the ascending colon, transverse colon, descending colon.  Proctitis was also noted in the rectum.  07/23/2020: MR Enterography with and without contrast showed narrowing of the terminal ileum involving approximately 6 cm.  06/30/2020: Hepatitis  serologies suggested that she is not immune to hepatitis a and B and need for immunization.  08/07/2020 bone density scan features of osteopenia noted.  Interval history 09/29/2020-11/20/2020 09/29/2020: Varicella-zoster IgG shows that she is immune normal, hemoglobin 13.4 g with normal iron studies.[name] on November 07, 2020.  Denies any symptoms doing well.  Having 1 bowel movement per day.  No bloating no abdominal discomfort.  Denies any NSAID use.  No nocturnal GI symptoms.  No cramping. Received 2 out of 3 doses of hepatitis A and B vaccine.  Taking calcium vitamin D supplementation.  Due to see endocrinologist for osteopenia in July 2022.  Has had a skin exam within the past 1 year.  She has had a hysterectomy and hence does not need a Pap smear. Patient brought in labs from outside performed by [facility] infusion performed on 11/13/2020.  Hemoglobin 14 g with an MCV of 92.  Platelet count 264.  CRP less than 1.  Albumin drawn but no results available.  Current Outpatient Medications  Medication Sig Dispense Refill  . balsalazide (COLAZAL) 750 MG capsule TAKE 3 CAPSULES (2,250 MG TOTAL) BY MOUTH 3 (THREE) TIMES DAILY.    . bimatoprost (LATISSE) 0.03 % ophthalmic solution Place 1 application into both eyes at bedtime. Place one drop on applicator and apply evenly along the skin of the upper eyelid at base of eyelashes once daily at bedtime; repeat procedure for second eye (use a clean applicator). 5 mL 12  . CALCIUM-VITAMIN D PO Take 1 tablet by mouth daily.    Marland Kitchen  Ferrous Sulfate (IRON PO) Take 27 mg by mouth daily.    Marland Kitchen gabapentin (NEURONTIN) 300 MG capsule TAKE 1 CAPSULE BY MOUTH TWICE A DAY    . HYDROcodone-acetaminophen (NORCO/VICODIN) 5-325 MG tablet Take 1-2 tablets by mouth every 6 (six) hours as needed for moderate pain or severe pain. (Patient not taking: No sig reported) 10 tablet 0  . mercaptopurine (PURINETHOL) 50 MG tablet Take 50 mg by mouth daily. Give on an empty stomach 1  hour before or 2 hours after meals. Caution: Chemotherapy.    . Multiple Vitamin (MULTI-VITAMIN) tablet Take by mouth.     No current facility-administered medications for this visit.    Allergies as of 11/20/2020 - Review Complete 11/19/2020  Allergen Reaction Noted  . Sulfa antibiotics Anaphylaxis and Swelling 11/11/2012    ROS:  General: Negative for anorexia, weight loss, fever, chills, fatigue, weakness. ENT: Negative for hoarseness, difficulty swallowing , nasal congestion. CV: Negative for chest pain, angina, palpitations, dyspnea on exertion, peripheral edema.  Respiratory: Negative for dyspnea at rest, dyspnea on exertion, cough, sputum, wheezing.  GI: See history of present illness. GU:  Negative for dysuria, hematuria, urinary incontinence, urinary frequency, nocturnal urination.  Endo: Negative for unusual weight change.    Physical Examination:   LMP 09/15/2012   General: Well-nourished, well-developed in no acute distress.  Eyes: No icterus. Conjunctivae pink. Mouth: Oropharyngeal mucosa moist and pink , no lesions erythema or exudate. Lungs: Clear to auscultation bilaterally. Non-labored. Heart: Regular rate and rhythm, no murmurs rubs or gallops.  Abdomen: Bowel sounds are normal, nontender, nondistended, no hepatosplenomegaly or masses, no abdominal bruits or hernia , no rebound or guarding.   Neuro: Alert and oriented x 3.  Grossly intact. Skin: Warm and dry, no jaundice.   Psych: Alert and cooperative, normal mood and affect.   Imaging Studies: Botox Injection  Result Date: 11/19/2020 Location: See attached image Informed consent: Discussed risks (infection, pain, bleeding, bruising, swelling, allergic reaction, paralysis of nearby muscles, eyelid droop, double vision, neck weakness, difficulty breathing, headache, undesirable cosmetic result, and need for additional treatment) and benefits of the procedure, as well as the alternatives.  Informed consent was  obtained. Preparation: The area was cleansed with alcohol. Procedure Details:  Botox was injected into the dermis with a 30-gauge needle. Pressure applied to any bleeding. Ice packs offered for swelling. Lot Number:  [id number] Expiration:  12/2022 Total Units Injected:  6 Plan: Patient was instructed to remain upright for 4 hours. Patient was instructed to avoid massaging the face and avoid vigorous exercise for the rest of the day. Tylenol may be used for headache.  Allow 2 weeks before returning to clinic for additional dosing as needed. Patient will call for any problems.   Botox Injection  Result Date: 11/05/2020 Location: See attached image Informed consent: Discussed risks (infection, pain, bleeding, bruising, swelling, allergic reaction, paralysis of nearby muscles, eyelid droop, double vision, neck weakness, difficulty breathing, headache, undesirable cosmetic result, and need for additional treatment) and benefits of the procedure, as well as the alternatives.  Informed consent was obtained. Preparation: The area was cleansed with alcohol. Procedure Details:  Botox was injected into the dermis with a 30-gauge needle. Pressure applied to any bleeding. Ice packs offered for swelling. Lot Number:  [id number] Expiration:  12/2022 Total Units Injected:  34 Plan: Patient was instructed to remain upright for 4 hours. Patient was instructed to avoid massaging the face and avoid vigorous exercise for the rest of the day.  Tylenol may be used for headache.  Allow 2 weeks before returning to clinic for additional dosing as needed. Patient will call for any problems.    Assessment and Plan:   [name] is a 55 y.o. y/o female here to follow-up for Crohn's disease.  Transferred care to our practice in November 2021 after being treated by [facility] clinic gastroenterology for many years.  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 In the past Remicade has been attempted but  failed as she could not have IV access.  Commenced on Stelara in March 2022. Bone density scan showed osteopenia.  She is immune to varicella.  Clinically and biochemically in remission.  Plan 1.  Follow-up in endocrinology for evaluation of osteopenia in the setting of Crohn's disease with appointment set in July 2022 2.  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  Hepatitis A and B vaccination to be completed in 6 months, 4.  Continue Stelara, annual skin exam in a year 5.  Avoid NSAIDs 6.  We will perform a CT enterography in Sept-October 2022 , 6 months after starting Stelara  a  to look for improvement and may subsequently require a colonoscopy as well at that point of time.  Continue lab tests with [facility] infusion  Dr Jonathon Bellows  MD,MRCP [facility]) Follow up in 3 to 4 months

## 2020-11-25 ENCOUNTER — Other Ambulatory Visit: Payer: Self-pay

## 2020-11-26 ENCOUNTER — Encounter: Payer: Self-pay | Admitting: Gastroenterology

## 2020-12-03 ENCOUNTER — Telehealth: Payer: Self-pay | Admitting: Gastroenterology

## 2020-12-03 ENCOUNTER — Ambulatory Visit: Payer: 59 | Admitting: Dermatology

## 2020-12-03 ENCOUNTER — Telehealth: Payer: Self-pay

## 2020-12-03 NOTE — Telephone Encounter (Signed)
[name] is working on completing the PA through [employer] my meds. It will be faxed over.

## 2020-12-03 NOTE — Telephone Encounter (Signed)
Jasmine from [place] asking for prior authorization for pt Stelara.  Cover My Med number [id number].  She can be reached at [phone]

## 2020-12-03 NOTE — Telephone Encounter (Signed)
[facility] needs active health/medical benefits for patients prescription, Stelara. Pharmacy rejected the medication. Please call [facility] [phone]

## 2020-12-03 NOTE — Telephone Encounter (Signed)
Almyra Free from [facility] [phone] needs call back regarding medical benefits and authorization for patient's Stelara

## 2020-12-08 ENCOUNTER — Telehealth: Payer: Self-pay

## 2020-12-08 NOTE — Telephone Encounter (Signed)
Called patient to get information regarding her 1st infusion of Sterlera treatment. Informed Liliane Channel ([facility]) via e-mail, patient received 1st treatment on 11/13/20.

## 2020-12-24 ENCOUNTER — Telehealth: Payer: Self-pay | Admitting: Gastroenterology

## 2020-12-29 NOTE — Telephone Encounter (Signed)
Error

## 2020-12-31 ENCOUNTER — Telehealth: Payer: Self-pay | Admitting: Gastroenterology

## 2020-12-31 NOTE — Telephone Encounter (Signed)
Veleta Miners with [facility]- [phone] Did you receive a fax from her?  Application for [employer].  Please call to confirm

## 2020-12-31 NOTE — Telephone Encounter (Signed)
Returned the call. LVM providing the fax number. Did not receive paper work.

## 2021-01-27 ENCOUNTER — Encounter: Payer: Self-pay | Admitting: Endocrinology

## 2021-01-27 ENCOUNTER — Ambulatory Visit (INDEPENDENT_AMBULATORY_CARE_PROVIDER_SITE_OTHER): Payer: 59 | Admitting: Endocrinology

## 2021-01-27 ENCOUNTER — Other Ambulatory Visit: Payer: Self-pay

## 2021-01-27 DIAGNOSIS — M810 Age-related osteoporosis without current pathological fracture: Secondary | ICD-10-CM | POA: Insufficient documentation

## 2021-01-27 LAB — VITAMIN D 25 HYDROXY (VIT D DEFICIENCY, FRACTURES): VITD: 33.06 ng/mL (ref 30.00–100.00)

## 2021-01-27 NOTE — Progress Notes (Signed)
Subjective:    Patient ID: [name], female    DOB: 07/28/1966, 55 y.o.   MRN: [id number]  HPI Pt is referred by Dr Vicente Males, for osteoporosis.  Pt was noted to have osteoporosis in 2012.  She took Fosamax 2012-2015.  she has never had bony fracture.  She has no history of any of the following: multiple myeloma, renal dz, thyroid problems, alcoholism, smoking, liver dz, gastric bypass, urolithiasis, and hyperparathyroidism.  She does not take heparin or anticonvulsants.  She had TAH (no oophorect) at age 52.  She took steroids approx 1990-1995, for Crohn's Dz.  She takes Vit-D, at an uncertain dosage.   Past Medical History:  Diagnosis Date   Anemia    Crohn's disease ([place])    Polyp of colon     Past Surgical History:  Procedure Laterality Date   ABDOMINAL HYSTERECTOMY     ANTERIOR AND POSTERIOR REPAIR N/A 11/17/2012   Procedure: ANTERIOR (CYSTOCELE) AND POSTERIOR (RECTOCELE), Enterocele repair.;  Surgeon: Lovenia Kim, MD;  Location: [facility];  Service: Gynecology;  Laterality: N/A;  Also Enterocele repair.   BILATERAL SALPINGECTOMY Bilateral 11/17/2012   Procedure: BILATERAL SALPINGECTOMY;  Surgeon: Lovenia Kim, MD;  Location: [facility];  Service: Gynecology;  Laterality: Bilateral;   BLADDER SUSPENSION N/A 11/17/2012   Procedure: TRANSVAGINAL TAPE (TVT) PROCEDURE Sling with Cystoscopy.;  Surgeon: Lovenia Kim, MD;  Location: [facility];  Service: Gynecology;  Laterality: N/A;  TVT Sling with Cystoscopy.   BREAST LUMPECTOMY WITH RADIOACTIVE SEED LOCALIZATION Left 09/05/2020   Procedure: LEFT BREAST LUMPECTOMY WITH RADIOACTIVE SEED LOCALIZATION;  Surgeon: Jovita Kussmaul, MD;  Location: [facility];  Service: General;  Laterality: Left;   BREAST SURGERY     COLONOSCOPY WITH PROPOFOL N/A 08/15/2020   Procedure: COLONOSCOPY WITH PROPOFOL;  Surgeon: Jonathon Bellows, MD;  Location: [facility];  Service: Gastroenterology;  Laterality: N/A;   MANDIBLE SURGERY     ROBOTIC  ASSISTED TOTAL HYSTERECTOMY N/A 11/17/2012   Procedure: ROBOTIC ASSISTED TOTAL HYSTERECTOMY;  Surgeon: Lovenia Kim, MD;  Location: [facility];  Service: Gynecology;  Laterality: N/A;  Requests 3 1/2 hrs.    Social History   Socioeconomic History   Marital status: Married    Spouse name: Not on file   Number of children: Not on file   Years of education: Not on file   Highest education level: Some college, no degree  Occupational History   Not on file  Tobacco Use   Smoking status: Never   Smokeless tobacco: Never  Vaping Use   Vaping Use: Never used  Substance and Sexual Activity   Alcohol use: No   Drug use: No   Sexual activity: Not Currently  Other Topics Concern   Not on file  Social History Narrative   Not on file   Social Determinants of Health   Financial Resource Strain: Not on file  Food Insecurity: Not on file  Transportation Needs: Not on file  Physical Activity: Not on file  Stress: Not on file  Social Connections: Not on file  Intimate Partner Violence: Not on file    Current Outpatient Medications on File Prior to Visit  Medication Sig Dispense Refill   bimatoprost (LATISSE) 0.03 % ophthalmic solution Place 1 application into both eyes at bedtime. Place one drop on applicator and apply evenly along the skin of the upper eyelid at base of eyelashes once daily at bedtime; repeat procedure for second eye (use a clean applicator). 5  mL 12   CALCIUM-VITAMIN D PO Take 1 tablet by mouth daily.     gabapentin (NEURONTIN) 300 MG capsule TAKE 1 CAPSULE BY MOUTH TWICE A DAY     Multiple Vitamin (MULTI-VITAMIN) tablet Take by mouth.     No current facility-administered medications on file prior to visit.    Allergies  Allergen Reactions   Sulfa Antibiotics Anaphylaxis and Swelling    Family History  Problem Relation Age of Onset   Arthritis Mother    Cancer Mother    COPD Mother    Hearing loss Mother    Osteoporosis Mother    Alcohol abuse Father     Early death Father    Arthritis Brother    Diabetes Maternal Grandmother    Heart disease Maternal Grandmother    COPD Maternal Grandfather     BP 106/86   Pulse 86   Ht 5' 4"  (1.626 m)   Wt 170 lb (77.1 kg)   LMP 09/15/2012   SpO2 97%   BMI 29.18 kg/m     Review of Systems denies weight loss, heartburn, falls, and memory loss.  No change in chronic back pain.      Objective:   Physical Exam NECK: There is no palpable thyroid enlargement.  No thyroid nodule is palpable.  No palpable lymphadenopathy at the anterior neck.   SPINE: no kyphosis.   GAIT: normal and steady.    Lab Results  Component Value Date   CALCIUM 9.3 06/12/2020   Lab Results  Component Value Date   TSH 1.55 06/12/2020   Lab Results  Component Value Date   CREATININE 0.67 06/12/2020   BUN 10 06/12/2020   NA 137 06/12/2020   K 4.6 06/12/2020   CL 104 06/12/2020   CO2 28 06/12/2020    DEXA (2021): T-score is -1.1 (RFN)  I have reviewed outside records, and summarized: Pt was noted to have low BMD, and referred here.  She takes Stelara for Crohn's since early 2022.    25-OH Vit-D=35    Assessment & Plan:  Vit-D def: well-controlled.  Please continue the same Vit-D Osteoporosis, stable.  We'll follow  Patient Instructions  Blood tests are requested for you today.  We'll let you know about the results.   Please come back for a follow-up appointment in 18 months.

## 2021-01-27 NOTE — Patient Instructions (Addendum)
Blood tests are requested for you today.  We'll let you know about the results.   Please come back for a follow-up appointment in 18 months.

## 2021-01-28 ENCOUNTER — Encounter: Payer: Self-pay | Admitting: Endocrinology

## 2021-01-28 LAB — PTH, INTACT AND CALCIUM
Calcium: 9.3 mg/dL (ref 8.6–10.4)
PTH: 39 pg/mL (ref 16–77)

## 2021-02-19 ENCOUNTER — Ambulatory Visit (INDEPENDENT_AMBULATORY_CARE_PROVIDER_SITE_OTHER): Payer: 59 | Admitting: Gastroenterology

## 2021-02-19 ENCOUNTER — Encounter: Payer: Self-pay | Admitting: Gastroenterology

## 2021-02-19 ENCOUNTER — Other Ambulatory Visit: Payer: Self-pay

## 2021-02-19 VITALS — BP 108/61 | HR 66 | Temp 98.3°F | Ht 64.0 in | Wt 170.8 lb

## 2021-02-19 DIAGNOSIS — K5010 Crohn's disease of large intestine without complications: Secondary | ICD-10-CM | POA: Diagnosis not present

## 2021-02-19 NOTE — Progress Notes (Signed)
Jonathon Bellows MD, MRCP(U.K) [address]  [place]  [place], [place] [address]  Main: [phone]  Fax: [phone]   Primary Care Physician: Marval Regal, NP  Primary Gastroenterologist:  Dr. Jonathon Bellows   Chief complaint follow-up for Crohn's disease  HPI: [name] is a 55 y.o. female  Summary of history :   She transferred care to our office in November 2021 for Crohn's disease.  Previously seen by [facility] gastroenterology. She states that she was diagnosed with Crohn's disease at the age of 52 and since then has been on mesalamine and 6-MP for many years Prior records suggest she had Crohn's disease of the right colon since 55 years of age.  She had  been taking Celebrex 1 tablet daily for many years, had been on balsalazide with symptoms.  She recollect she also had prior inflammation of the terminal ileum.  Commenced on oral iron a few weeks prior to her initial visit.  Taken Remicade in the past but could not get IV access and stopped.  Had been up-to-date with her skin exams.  No recent abdominal imaging.   06/12/2020: CRP 1.0 hemoglobin 11.3 g with an MCV of 85 TSH of 1.55 CMP normal HIV negative vitamin D low at 16.7 B12 and folate normal.  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  Aphthous ulceration noted in the transverse colon splenic flexure ascending colon.  Mucosal pattern in the transverse and ascending colon including cecum was decreased diffusely.  Diverticulosis noted of the sigmoid colon.Path report demonstrated chronic colitis of the cecum with moderate activity in the ascending colon, transverse colon, descending colon.  Proctitis was also noted in the rectum.   07/23/2020: MR Enterography with and without contrast showed narrowing of the terminal ileum involving approximately 6 cm.   06/30/2020: Hepatitis serologies suggested that she is not immune to  hepatitis a and B and need for immunization.   08/07/2020 bone density scan features of osteopenia noted.    09/29/2020: Varicella-zoster IgG shows that she is immune normal, hemoglobin 13.4 g with normal iron studies.Arneta Cliche on November 07, 2020.   Interval history 11/20/2020-02/19/2021  Taking calcium vitamin D supplementation. 01/27/2021 seen by endocrinology for osteoporosis has a follow-up in 18 months. 01/27/2021: Vitamin D 33, PTH calcium normal.  She is doing well overall.  Has a bowel movement daily.  She feels that closer to the 8-week duration when she is due for a dose of Stelara has some loose stools.  Denies any abdominal pain nausea vomiting.  No other complaints.    Current Outpatient Medications  Medication Sig Dispense Refill   bimatoprost (LATISSE) 0.03 % ophthalmic solution Place 1 application into both eyes at bedtime. Place one drop on applicator and apply evenly along the skin of the upper eyelid at base of eyelashes once daily at bedtime; repeat procedure for second eye (use a clean applicator). 5 mL 12   CALCIUM-VITAMIN D PO Take 1 tablet by mouth daily.     gabapentin (NEURONTIN) 300 MG capsule TAKE 1 CAPSULE BY MOUTH TWICE A DAY     Multiple Vitamin (MULTI-VITAMIN) tablet Take by mouth.     No current facility-administered medications for this visit.    Allergies as of 02/19/2021 - Review Complete 02/19/2021  Allergen Reaction Noted   Sulfa antibiotics Anaphylaxis and Swelling 11/11/2012    ROS:  General: Negative for anorexia,  weight loss, fever, chills, fatigue, weakness. ENT: Negative for hoarseness, difficulty swallowing , nasal congestion. CV: Negative for chest pain, angina, palpitations, dyspnea on exertion, peripheral edema.  Respiratory: Negative for dyspnea at rest, dyspnea on exertion, cough, sputum, wheezing.  GI: See history of present illness. GU:  Negative for dysuria, hematuria, urinary incontinence, urinary frequency, nocturnal  urination.  Endo: Negative for unusual weight change.    Physical Examination:   BP 108/61   Pulse 66   Temp 98.3 F (36.8 C) (Oral)   Ht _0  (1.626 m)   Wt 170 lb 12.8 oz (77.5 kg)   LMP 09/15/2012   BMI 29.32 kg/m   General: Well-nourished, well-developed in no acute distress.  Eyes: No icterus. Conjunctivae pink. Abdomen: Bowel sounds are normal, nontender, nondistended, no hepatosplenomegaly or masses, no abdominal bruits or hernia , no rebound or guarding.   Extremities: No lower extremity edema. No clubbing or deformities. Neuro: Alert and oriented x 3.  Grossly intact. Skin: Warm and dry, no jaundice.   Psych: Alert and cooperative, normal mood and affect.   Imaging Studies: No results found.  Assessment and Plan:   [name] is a 55 y.o. y/o female here to follow-up for Crohn's disease of the terminal ileum as well as colon.  Transferred care to our practice in November 2021 after being treated by [facility] clinic gastroenterology for many years.  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 In the past Remicade has been attempted but failed as she could not have IV access.  Commenced on Stelara in March 2022. Bone density scan showed osteopenia.  She is immune to varicella.  Clinically and biochemically in remission.   Plan 1.  Continue to follow-up with endocrinology for osteoporosis 2.  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  Hepatitis A and B vaccination to be completed in 6 months, i.e. October 2022, will check for immunity after that 4.  Continue Stelara, since she is having symptoms between the 6 and 8 weeks after her dose we will check her labs including fecal calprotectin a few days before her next dose to check for any active inflammation.  annual skin exam in a year 5.  Avoid NSAIDs 6.  We will perform a MRI enterography in Sept-October 2022 , depending on the results of the MRI will plan for colonoscopy 7.  I will also  check her Stelara drug level and antibody levels just prior to next dose    Dr Jonathon Bellows  MD,MRCP [facility]) Follow up in October 2022

## 2021-02-19 NOTE — Addendum Note (Signed)
Addended by: Wayna Chalet on: 02/19/2021 03:34 PM   Modules accepted: Orders

## 2021-02-25 ENCOUNTER — Other Ambulatory Visit: Payer: Self-pay

## 2021-02-25 ENCOUNTER — Other Ambulatory Visit: Payer: Self-pay | Admitting: Gastroenterology

## 2021-02-25 DIAGNOSIS — K5010 Crohn's disease of large intestine without complications: Secondary | ICD-10-CM

## 2021-03-01 ENCOUNTER — Ambulatory Visit: Admission: RE | Admit: 2021-03-01 | Payer: 59 | Source: Ambulatory Visit

## 2021-03-02 ENCOUNTER — Ambulatory Visit: Admission: RE | Admit: 2021-03-02 | Payer: 59 | Source: Ambulatory Visit

## 2021-03-04 NOTE — Telephone Encounter (Signed)
Please call patient about labs

## 2021-03-10 ENCOUNTER — Other Ambulatory Visit: Payer: Self-pay

## 2021-03-10 DIAGNOSIS — K5010 Crohn's disease of large intestine without complications: Secondary | ICD-10-CM

## 2021-03-23 ENCOUNTER — Encounter: Payer: Self-pay | Admitting: Gastroenterology

## 2021-03-25 ENCOUNTER — Encounter: Payer: Self-pay | Admitting: Gastroenterology

## 2021-04-01 ENCOUNTER — Other Ambulatory Visit: Payer: Self-pay

## 2021-04-01 ENCOUNTER — Ambulatory Visit (INDEPENDENT_AMBULATORY_CARE_PROVIDER_SITE_OTHER): Payer: 59 | Admitting: Gastroenterology

## 2021-04-01 DIAGNOSIS — Z23 Encounter for immunization: Secondary | ICD-10-CM | POA: Diagnosis not present

## 2021-04-01 NOTE — Progress Notes (Signed)
Patient is here today to get her Hep A and B vaccine.

## 2021-05-10 ENCOUNTER — Other Ambulatory Visit: Payer: Self-pay

## 2021-05-10 ENCOUNTER — Ambulatory Visit
Admission: RE | Admit: 2021-05-10 | Discharge: 2021-05-10 | Disposition: A | Discharge status: Home / Self Care | Payer: 59 | Source: Ambulatory Visit | Attending: Gastroenterology | Admitting: Gastroenterology

## 2021-05-10 DIAGNOSIS — K5010 Crohn's disease of large intestine without complications: Secondary | ICD-10-CM | POA: Insufficient documentation

## 2021-05-10 MED ORDER — GADOBUTROL 1 MMOL/ML IV SOLN
7.0000 mL | Freq: Once | INTRAVENOUS | Status: AC | PRN
  Administered 2021-05-10: 7.5 mL via INTRAVENOUS

## 2021-05-18 LAB — COMPREHENSIVE METABOLIC PANEL
ALT: 17 IU/L (ref 0–32)
AST: 19 IU/L (ref 0–40)
Albumin/Globulin Ratio: 1.8 (ref 1.2–2.2)
Albumin: 4.4 g/dL (ref 3.8–4.9)
Alkaline Phosphatase: 86 IU/L (ref 44–121)
BUN/Creatinine Ratio: 13 (ref 9–23)
BUN: 9 mg/dL (ref 6–24)
Bilirubin Total: 0.6 mg/dL (ref 0.0–1.2)
CO2: 23 mmol/L (ref 20–29)
Calcium: 9.4 mg/dL (ref 8.7–10.2)
Chloride: 103 mmol/L (ref 96–106)
Creatinine, Ser: 0.68 mg/dL (ref 0.57–1.00)
Globulin, Total: 2.4 g/dL (ref 1.5–4.5)
Glucose: 93 mg/dL (ref 70–99)
Potassium: 4.4 mmol/L (ref 3.5–5.2)
Sodium: 140 mmol/L (ref 134–144)
Total Protein: 6.8 g/dL (ref 6.0–8.5)
eGFR: 103 mL/min/{1.73_m2} (ref >59)

## 2021-05-18 LAB — CBC
Hematocrit: 41.4 % (ref 34.0–46.6)
Hemoglobin: 14 g/dL (ref 11.1–15.9)
MCH: 30.2 pg (ref 26.6–33.0)
MCHC: 33.8 g/dL (ref 31.5–35.7)
MCV: 89 fL (ref 79–97)
Platelets: 228 10*3/uL (ref 150–450)
RBC: 4.64 x10E6/uL (ref 3.77–5.28)
RDW: 13 % (ref 11.7–15.4)
WBC: 8.6 10*3/uL (ref 3.4–10.8)

## 2021-05-18 LAB — USTEKINUMAB AND ANTI-USTEK AB
Anti-Ustekinumab Antibody: <40 ng/mL
Ustekinumab: 1.4 ug/mL

## 2021-05-18 LAB — SERIAL MONITORING

## 2021-05-18 LAB — C-REACTIVE PROTEIN: CRP: 9 mg/L (ref 0–10)

## 2021-05-18 LAB — CALPROTECTIN, FECAL: Calprotectin, Fecal: 87 ug/g (ref 0–120)

## 2021-05-25 ENCOUNTER — Other Ambulatory Visit: Payer: Self-pay

## 2021-05-25 ENCOUNTER — Encounter: Payer: Self-pay | Admitting: Gastroenterology

## 2021-05-25 ENCOUNTER — Ambulatory Visit (INDEPENDENT_AMBULATORY_CARE_PROVIDER_SITE_OTHER): Payer: 59 | Admitting: Gastroenterology

## 2021-05-25 VITALS — BP 107/71 | HR 69 | Temp 97.9°F | Ht 64.0 in | Wt 168.0 lb

## 2021-05-25 DIAGNOSIS — K5010 Crohn's disease of large intestine without complications: Secondary | ICD-10-CM

## 2021-05-25 MED ORDER — NA SULFATE-K SULFATE-MG SULF 17.5-3.13-1.6 GM/177ML PO SOLN: 354.0000 mL | Freq: Once | ORAL | 0 refills | Status: AC

## 2021-05-25 NOTE — Addendum Note (Signed)
Addended by: Wayna Chalet on: 05/25/2021 03:51 PM   Modules accepted: Orders

## 2021-05-25 NOTE — Progress Notes (Signed)
Caroline Bellows MD, MRCP(U.K) [address]  [place]  [place], [place] [address]  Main: [phone]  Fax: [phone]   Primary Care Physician: Marval Regal, NP  Primary Gastroenterologist:  Dr. Jonathon Gilbert    Chief complaint: Follow-up for Crohn's disease of the distal ileum along with osteopenia  on Stelara since April 2022   HPI: [name] is a 55 y.o. female  Summary of history :   She transferred care to our office in November 2021 for Crohn's disease.  Previously seen by [facility] gastroenterology. She states that she was diagnosed with Crohn's disease at the age of 22 and since then has been on mesalamine and 6-MP for many years Prior records suggest she had Crohn's disease of the right colon since 55 years of age.  She had  been taking Celebrex 1 tablet daily for many years, had been on balsalazide with symptoms.  She recollect she also had prior inflammation of the terminal ileum.  Commenced on oral iron a few weeks prior to her initial visit.  Taken Remicade in the past but could not get IV access and stopped.  Had been up-to-date with her skin exams.  No recent abdominal imaging.   06/12/2020: CRP 1.0 hemoglobin 11.3 g with an MCV of 85 TSH of 1.55 CMP normal HIV negative vitamin D low at 16.7 B12 and folate normal.  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  Aphthous ulceration noted in the transverse colon splenic flexure ascending colon.  Mucosal pattern in the transverse and ascending colon including cecum was decreased diffusely.  Diverticulosis noted of the sigmoid colon.Path report demonstrated chronic colitis of the cecum with moderate activity in the ascending colon, transverse colon, descending colon.  Proctitis was also noted in the rectum.   07/23/2020: MR Enterography with and without contrast showed narrowing of the terminal ileum involving approximately 6 cm.    06/30/2020: Hepatitis serologies suggested that she is not immune to hepatitis a and B and need for immunization.   08/07/2020 bone density scan features of osteopenia noted.     09/29/2020: Varicella-zoster IgG shows that she is immune normal, hemoglobin 13.4 g with normal iron studies.Started Delsa Grana on November 07, 2020. 01/27/2021 seen by endocrinology for osteoporosis has a follow-up in 18 months. 01/27/2021: Vitamin D 33, PTH calcium normal.    Interval history 02/19/2021-05/25/2021  05/06/2021: Hemoglobin 14 g with an MCV of 89, CRP of 9, CMP shows no abnormalities, fecal calprotectin borderline ustekinumab drug level 1.4.  No antibodies detected    05/10/2021: MR enterography shows previously noted thickening of the terminal ileum has improved.  2.8 cm of ileum where there is residual asymmetrical wall thickening with luminal narrowing.  No prestenotic dilation noted.  Thickening of the ileocecal valve region as well with luminal narrowing.  Improved appearance of the proximal ascending colon.  No inflammatory or flat stranding is noted.    Since her last visit she is doing well.  Previously she was having symptoms closer to the 8-week mark when she was due for her Stelara.  Presently has no symptoms.  Has 1 bowel movement per day.  No bloating, abdominal pain, abdominal distention.  No other problems.  Not taking any NSAIDs.   Current Outpatient Medications  Medication Sig Dispense Refill   bimatoprost (LATISSE) 0.03 % ophthalmic solution Place 1 application into both eyes at bedtime. Place one drop  on applicator and apply evenly along the skin of the upper eyelid at base of eyelashes once daily at bedtime; repeat procedure for second eye (use a clean applicator). 5 mL 12   CALCIUM-VITAMIN D PO Take 1 tablet by mouth daily.     gabapentin (NEURONTIN) 300 MG capsule TAKE 1 CAPSULE BY MOUTH TWICE A DAY     Multiple Vitamin (MULTI-VITAMIN) tablet Take by mouth.     No current  facility-administered medications for this visit.    Allergies as of 05/25/2021 - Review Complete 05/25/2021  Allergen Reaction Noted   Sulfa antibiotics Anaphylaxis and Swelling 11/11/2012    ROS:  General: Negative for anorexia, weight loss, fever, chills, fatigue, weakness. ENT: Negative for hoarseness, difficulty swallowing , nasal congestion. CV: Negative for chest pain, angina, palpitations, dyspnea on exertion, peripheral edema.  Respiratory: Negative for dyspnea at rest, dyspnea on exertion, cough, sputum, wheezing.  GI: See history of present illness. GU:  Negative for dysuria, hematuria, urinary incontinence, urinary frequency, nocturnal urination.  Endo: Negative for unusual weight change.    Physical Examination:   BP 107/71   Pulse 69   Temp 97.9 F (36.6 C) (Oral)   Ht _0  (1.626 m)   Wt 168 lb (76.2 kg)   LMP 09/15/2012   BMI 28.84 kg/m   General: Well-nourished, well-developed in no acute distress.  Eyes: No icterus. Conjunctivae pink. Neuro: Alert and oriented x 3.  Grossly intact. Skin: Warm and dry, no jaundice.   Psych: Alert and cooperative, normal mood and affect.   Imaging Studies: MR ENTERO ABDOMEN W WO CONTRAST  Result Date: 05/11/2021 CLINICAL DATA:  Evaluate for Crohn's disease. EXAM: MR ABDOMEN AND PELVIS WITHOUT AND WITH CONTRAST (MR ENTEROGRAPHY) TECHNIQUE: Multiplanar, multisequence MRI of the abdomen and pelvis was performed both before and during bolus administration of intravenous contrast. Negative oral contrast VoLumen was given. CONTRAST:  7.31m GADAVIST GADOBUTROL 1 MMOL/ML IV SOLN COMPARISON:  07/22/20 FINDINGS: COMBINED FINDINGS FOR BOTH MR ABDOMEN AND PELVIS Lower chest: No acute findings. Hepatobiliary: Scattered liver cysts are again seen. Gallbladder negative. Pancreas: No mass, inflammatory changes, or other parenchymal abnormality identified. Spleen:  Within normal limits in size and appearance. Adrenals/Urinary Tract: Normal  adrenal glands. Small bilateral renal cysts measure up to 1.2 cm. No mass or hydronephrosis. Bladder unremarkable. Stomach/Bowel: Stomach appears normal. No signs of bowel obstruction. Previously noted wall thickening involving the terminal ileum are improved in the interval. Approximally 2.4 cm proximal to the IC valve there is a short segment (2.8 cm) of ileum where there is residual asymmetric wall thickening with luminal narrowing. The wall thickening measures up to 0.7 cm in thickness, image 56/7. No pre stenotic dilatation identified. A second segment where the ileum meets the cecum there is residual circumferential wall thickening and luminal narrowing. Wall thickness measures up to 9 mm, image 45/7. No surrounding inflammatory fat stranding, free fluid, or fluid collections identified. No signs of penetrating disease. Diminished haustral folds involving the proximal ascending colon, image 26/4. When compared with the previous exam these changes appear improved in the interval. Vascular/Lymphatic: No pathologically enlarged lymph nodes identified. No abdominal aortic aneurysm demonstrated. Reproductive: Status post hysterectomy. No adnexal masses. Other: No significant free fluid scratch set no free fluid or fluid collections identified. Musculoskeletal: No suspicious bone lesions identified. IMPRESSION: 1. Since the previous exam there is been interval improvement in the extent abnormal wall thickening involving the terminal ileum. Two short segments of residual wall thickening and luminal narrowing  are identified within the terminal ileum as described above. No surrounding inflammatory fat stranding, free fluid or fluid collections. No signs of penetrating disease. 2. Diminished haustral folds involving the proximal ascending colon. Likely reflecting sequelae of chronic inflammation. 3. Hepatic and renal cysts. Electronically Signed   By: Kerby Moors M.D.   On: 05/11/2021 09:42   MR ENTERO PELVIS W WO  CONTRAST  Result Date: 05/11/2021 CLINICAL DATA:  Evaluate for Crohn's disease. EXAM: MR ABDOMEN AND PELVIS WITHOUT AND WITH CONTRAST (MR ENTEROGRAPHY) TECHNIQUE: Multiplanar, multisequence MRI of the abdomen and pelvis was performed both before and during bolus administration of intravenous contrast. Negative oral contrast VoLumen was given. CONTRAST:  7.74m GADAVIST GADOBUTROL 1 MMOL/ML IV SOLN COMPARISON:  07/22/20 FINDINGS: COMBINED FINDINGS FOR BOTH MR ABDOMEN AND PELVIS Lower chest: No acute findings. Hepatobiliary: Scattered liver cysts are again seen. Gallbladder negative. Pancreas: No mass, inflammatory changes, or other parenchymal abnormality identified. Spleen:  Within normal limits in size and appearance. Adrenals/Urinary Tract: Normal adrenal glands. Small bilateral renal cysts measure up to 1.2 cm. No mass or hydronephrosis. Bladder unremarkable. Stomach/Bowel: Stomach appears normal. No signs of bowel obstruction. Previously noted wall thickening involving the terminal ileum are improved in the interval. Approximally 2.4 cm proximal to the IC valve there is a short segment (2.8 cm) of ileum where there is residual asymmetric wall thickening with luminal narrowing. The wall thickening measures up to 0.7 cm in thickness, image 56/7. No pre stenotic dilatation identified. A second segment where the ileum meets the cecum there is residual circumferential wall thickening and luminal narrowing. Wall thickness measures up to 9 mm, image 45/7. No surrounding inflammatory fat stranding, free fluid, or fluid collections identified. No signs of penetrating disease. Diminished haustral folds involving the proximal ascending colon, image 26/4. When compared with the previous exam these changes appear improved in the interval. Vascular/Lymphatic: No pathologically enlarged lymph nodes identified. No abdominal aortic aneurysm demonstrated. Reproductive: Status post hysterectomy. No adnexal masses. Other: No  significant free fluid scratch set no free fluid or fluid collections identified. Musculoskeletal: No suspicious bone lesions identified. IMPRESSION: 1. Since the previous exam there is been interval improvement in the extent abnormal wall thickening involving the terminal ileum. Two short segments of residual wall thickening and luminal narrowing are identified within the terminal ileum as described above. No surrounding inflammatory fat stranding, free fluid or fluid collections. No signs of penetrating disease. 2. Diminished haustral folds involving the proximal ascending colon. Likely reflecting sequelae of chronic inflammation. 3. Hepatic and renal cysts. Electronically Signed   By: TKerby MoorsM.D.   On: 05/11/2021 09:42    Assessment and Plan:   Caroline HONEis a 55y.o. y/o female  here to follow-up for Crohn's disease of the terminal ileum as well as colon.  Transferred care to our practice in November 2021 [facility].  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 In the past Remicade has been attempted but failed as she could not have IV access.  Commenced on Stelara in March 2022. Bone density scan showed osteopenia.  She is immune to varicella.  Clinically and biochemically in remission.  MRI on 05/10/2021 shows improvement and no active inflammation.  Last colonoscopy was in January 2022 prior to commencing on Stelara and she had active disease . presently she is radiologically biochemically and clinically in remission.   Plan 1.  Continue to follow-up  with endocrinology for osteoporosis 2.  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  Check antibodies to hepatitis a and B status postvaccination 4.  Her ustekinumab levels are at her place where 80% will be in endoscopic remission.  We will get a colonoscopy ,if her colonoscopy shows active disease then we will have to give her medication at a shorter  interval to achieve endoscopic remission.  Annual skin exam in a year 5.  Avoid NSAIDs  I have discussed alternative options, risks & benefits,  which include, but are not limited to, bleeding, infection, perforation,respiratory complication & drug reaction.  The patient agrees with this plan & written consent will be obtained.    Dr Caroline Bellows  MD,MRCP [facility]) Follow up in 3 to 4 months

## 2021-07-27 ENCOUNTER — Ambulatory Visit: Payer: Self-pay | Admitting: Dermatology

## 2021-07-28 ENCOUNTER — Telehealth: Payer: Self-pay | Admitting: Gastroenterology

## 2021-07-28 NOTE — Telephone Encounter (Signed)
Inbound call from pt stating that she wants to cxl her procedure. Pt stated she doesn't want to r/s due to her transferring back to [facility]. Thank you.

## 2021-07-29 NOTE — Telephone Encounter (Signed)
Called the endoscopy unit and let them know that the patient was cancelling her procedure I/27/23.

## 2021-09-04 ENCOUNTER — Ambulatory Visit: Admit: 2021-09-04 | Payer: 59 | Admitting: Gastroenterology

## 2021-09-04 SURGERY — COLONOSCOPY WITH PROPOFOL
Anesthesia: General

## 2021-09-16 ENCOUNTER — Other Ambulatory Visit: Payer: Self-pay

## 2021-09-16 ENCOUNTER — Ambulatory Visit (INDEPENDENT_AMBULATORY_CARE_PROVIDER_SITE_OTHER): Payer: 59 | Admitting: Dermatology

## 2021-09-16 DIAGNOSIS — Z1283 Encounter for screening for malignant neoplasm of skin: Secondary | ICD-10-CM

## 2021-09-16 DIAGNOSIS — L578 Other skin changes due to chronic exposure to nonionizing radiation: Secondary | ICD-10-CM | POA: Diagnosis not present

## 2021-09-16 DIAGNOSIS — L988 Other specified disorders of the skin and subcutaneous tissue: Secondary | ICD-10-CM

## 2021-09-16 DIAGNOSIS — L821 Other seborrheic keratosis: Secondary | ICD-10-CM

## 2021-09-16 DIAGNOSIS — D229 Melanocytic nevi, unspecified: Secondary | ICD-10-CM | POA: Diagnosis not present

## 2021-09-16 DIAGNOSIS — L814 Other melanin hyperpigmentation: Secondary | ICD-10-CM

## 2021-09-16 DIAGNOSIS — D1800 Hemangioma unspecified site: Secondary | ICD-10-CM

## 2021-09-16 NOTE — Patient Instructions (Addendum)
Recommend [employer] in [facility]    Seborrheic Keratosis  What causes seborrheic keratoses? Seborrheic keratoses are harmless, common skin growths that first appear during adult life.  As time goes by, more growths appear.  Some people may develop a large number of them.  Seborrheic keratoses appear on both covered and uncovered body parts.  They are not caused by sunlight.  The tendency to develop seborrheic keratoses can be inherited.  They vary in color from skin-colored to gray, brown, or even black.  They can be either smooth or have a rough, warty surface.   Seborrheic keratoses are superficial and look as if they were stuck on the skin.  Under the microscope this type of keratosis looks like layers upon layers of skin.  That is why at times the top layer may seem to fall off, but the rest of the growth remains and re-grows.    Treatment Seborrheic keratoses do not need to be treated, but can easily be removed in the office.  Seborrheic keratoses often cause symptoms when they rub on clothing or jewelry.  Lesions can be in the way of shaving.  If they become inflamed, they can cause itching, soreness, or burning.  Removal of a seborrheic keratosis can be accomplished by freezing, burning, or surgery. If any spot bleeds, scabs, or grows rapidly, please return to have it checked, as these can be an indication of a skin cancer.    Recommend taking Heliocare sun protection supplement daily in sunny weather for additional sun protection. For maximum protection on the sunniest days, you can take up to 2 capsules of regular Heliocare OR take 1 capsule of Heliocare Ultra. For prolonged exposure (such as a full day in the sun), you can repeat your dose of the supplement 4 hours after your first dose. Heliocare can be purchased at [employer], at some [facility] or at [email or web address].     Melanoma ABCDEs  Melanoma is the most dangerous type of skin cancer, and is the leading  cause of death from skin disease.  You are more likely to develop melanoma if you: Have light-colored skin, light-colored eyes, or red or blond hair Spend a lot of time in the sun Tan regularly, either outdoors or in a tanning bed Have had blistering sunburns, especially during childhood Have a close family member who has had a melanoma Have atypical moles or large birthmarks  Early detection of melanoma is key since treatment is typically straightforward and cure rates are extremely high if we catch it early.   The first sign of melanoma is often a change in a mole or a new dark spot.  The ABCDE system is a way of remembering the signs of melanoma.  A for asymmetry:  The two halves do not match. B for border:  The edges of the growth are irregular. C for color:  A mixture of colors are present instead of an even brown color. D for diameter:  Melanomas are usually (but not always) greater than 30m - the size of a pencil eraser. E for evolution:  The spot keeps changing in size, shape, and color.  Please check your skin once per month between visits. You can use a small mirror in front and a large mirror behind you to keep an eye on the back side or your body.   If you see any new or changing lesions before your next follow-up, please call to schedule a visit.  Please continue daily skin protection  including broad spectrum sunscreen SPF 30+ to sun-exposed areas, reapplying every 2 hours as needed when you're outdoors.   Staying in the shade or wearing long sleeves, sun glasses (UVA+UVB protection) and wide brim hats (4-inch brim around the entire circumference of the hat) are also recommended for sun protection.    If You Need Anything After Your Visit  If you have any questions or concerns for your doctor, please call our main line at [phone] and press option 4 to reach your doctor's medical assistant. If no one answers, please leave a voicemail as directed and we will return your  call as soon as possible. Messages left after 4 pm will be answered the following business day.   You may also send [place] a message via [place]. We typically respond to MyChart messages within 1-2 business days.  For prescription refills, please ask your pharmacy to contact our office. Our fax number is [phone].  If you have an urgent issue when the clinic is closed that cannot wait until the next business day, you can page your doctor at the number below.    Please note that while we do our best to be available for urgent issues outside of office hours, we are not available 24/7.   If you have an urgent issue and are unable to reach [place], you may choose to seek medical care at your doctor's office, retail clinic, urgent care center, or emergency room.  If you have a medical emergency, please immediately call 911 or go to the emergency department.  Pager Numbers  - Dr. Nehemiah Massed: [phone]  - Dr. Laurence Ferrari: [phone]  - Dr. Nicole Kindred: [phone]  In the event of inclement weather, please call our main line at [phone] for an update on the status of any delays or closures.  Dermatology Medication Tips: Please keep the boxes that topical medications come in in order to help keep track of the instructions about where and how to use these. Pharmacies typically print the medication instructions only on the boxes and not directly on the medication tubes.   If your medication is too expensive, please contact our office at [phone] option 4 or send [place] a message through [place].   We are unable to tell what your co-pay for medications will be in advance as this is different depending on your insurance coverage. However, we may be able to find a substitute medication at lower cost or fill out paperwork to get insurance to cover a needed medication.   If a prior authorization is required to get your medication covered by your insurance company, please allow [place] 1-2 business days to  complete this process.  Drug prices often vary depending on where the prescription is filled and some pharmacies may offer cheaper prices.  The website www.goodrx.com contains coupons for medications through different pharmacies. The prices here do not account for what the cost may be with help from insurance (it may be cheaper with your insurance), but the website can give you the price if you did not use any insurance.  - You can print the associated coupon and take it with your prescription to the pharmacy.  - You may also stop by our office during regular business hours and pick up a GoodRx coupon card.  - If you need your prescription sent electronically to a different pharmacy, notify our office through [facility] or by phone at [phone] option 4.     Si Usted Necesita Algo Despus de Su Visita  Tambin puede enviarnos un mensaje a travs de MyChart. Por lo general respondemos a los mensajes de MyChart en el transcurso de 1 a 2 das hbiles.  Para renovar recetas, por favor pida a su farmacia que se ponga en contacto con nuestra oficina. Harland Dingwall de fax es [place] [phone].  Si tiene un asunto urgente cuando la clnica est cerrada y que no puede esperar hasta el siguiente da hbil, puede llamar/localizar a su doctor(a) al nmero que aparece a continuacin.   Por favor, tenga en cuenta que aunque hacemos todo lo posible para estar disponibles para asuntos urgentes fuera del horario de [place], no estamos disponibles las 24 horas del da, los 7 das de la [place].   Si tiene un problema urgente y no puede comunicarse con nosotros, puede optar por buscar atencin mdica  en el consultorio de su doctor(a), en una clnica privada, en un centro de atencin urgente o en una sala de emergencias.  Si tiene Engineering geologist, por favor llame inmediatamente al 911 o vaya a la sala de emergencias.  Nmeros de bper  - Dr. Nehemiah Massed: [phone]  - Dra. Moye:  [phone]  - Dra. Nicole Kindred: [phone]  En caso de inclemencias del [place], por favor llame a Johnsie Kindred principal al [phone] para una actualizacin sobre el [place] de cualquier retraso o cierre.  Consejos para la medicacin en dermatologa: Por favor, guarde las cajas en las que vienen los medicamentos de uso tpico para ayudarle a seguir las instrucciones sobre dnde y cmo usarlos. Las farmacias generalmente imprimen las instrucciones del medicamento slo en las cajas y no directamente en los tubos del [place].   Si su medicamento es muy caro, por favor, pngase en contacto con Zigmund Daniel llamando al [phone] y presione la opcin 4 o envenos un mensaje a travs de Pharmacist, community.   No podemos decirle cul ser su copago por los medicamentos por adelantado ya que esto es diferente dependiendo de la cobertura de su seguro. Sin embargo, es posible que podamos encontrar un medicamento sustituto a Electrical engineer un formulario para que el seguro cubra el medicamento que se considera necesario.   Si se requiere una autorizacin previa para que su compaa de seguros [place] su medicamento, por favor permtanos de 1 a 2 das hbiles para completar este proceso.  Los precios de los medicamentos varan con frecuencia dependiendo del Environmental consultant de dnde se surte la receta y alguna farmacias pueden ofrecer precios ms baratos.  El sitio web www.goodrx.com tiene cupones para medicamentos de Airline pilot. Los precios aqu no tienen en cuenta lo que podra costar con la ayuda del seguro (puede ser ms barato con su seguro), pero el sitio web puede darle el precio si no utiliz Research scientist (physical sciences).  - Puede imprimir el cupn correspondiente y llevarlo con su receta a la farmacia.  - Tambin puede pasar por nuestra oficina durante el horario de atencin regular y Charity fundraiser una tarjeta de cupones de GoodRx.  - Si necesita que su receta se enve electrnicamente a una farmacia diferente,  informe a nuestra oficina a travs de MyChart de [place] o por telfono llamando al [phone] y presione la opcin 4.

## 2021-09-16 NOTE — Progress Notes (Signed)
° °  Follow-Up Visit   Subjective  [name] is a 56 y.o. female who presents for the following: Follow-up (Patient here today for tbse. Patient has a few spots  she would like checked. Patient reports some thinning of skin under right eye and she would like to discuss. ).  The following portions of the chart were reviewed this encounter and updated as appropriate:  Tobacco   Allergies   Meds   Problems   Med Hx   Surg Hx   Fam Hx       Review of Systems: No other skin or systemic complaints except as noted in HPI or Assessment and Plan.   Objective  Well appearing patient in no apparent distress; mood and affect are within normal limits.  A full examination was performed including scalp, head, eyes, ears, nose, lips, neck, chest, axillae, abdomen, back, buttocks, bilateral upper extremities, bilateral lower extremities, hands, feet, fingers, toes, fingernails, and toenails. All findings within normal limits unless otherwise noted below.  face Rhytides and volume loss.    Assessment & Plan  Elastosis of skin face  Discussed the Perfect Peel $275 for dark spots plus rhytides     Lentigines - Scattered tan macules - Due to sun exposure - Benign-appearing, observe - Recommend daily broad spectrum sunscreen SPF 30+ to sun-exposed areas, reapply every 2 hours as needed. - Call for any changes  Seborrheic Keratoses - Stuck-on, waxy, tan-brown papules and/or plaques at right shoulder and right leg  - Benign-appearing - Discussed benign etiology and prognosis. - Observe - Call for any changes  Melanocytic Nevi - Tan-brown and/or pink-flesh-colored symmetric macules and papules - Benign appearing on exam today - Observation - Call clinic for new or changing moles - Recommend daily use of broad spectrum spf 30+ sunscreen to sun-exposed areas.   Hemangiomas - Red papules - Discussed benign nature - Observe - Call for any changes  Actinic Damage - Chronic condition,  secondary to cumulative UV/sun exposure - diffuse scaly erythematous macules with underlying dyspigmentation - Recommend daily broad spectrum sunscreen SPF 30+ to sun-exposed areas, reapply every 2 hours as needed.  - Staying in the shade or wearing long sleeves, sun glasses (UVA+UVB protection) and wide brim hats (4-inch brim around the entire circumference of the hat) are also recommended for sun protection.  - Call for new or changing lesions.  Skin cancer screening performed today. Return for 1 - 2 year tbse . I, Ruthell Rummage, CMA, am acting as scribe for Forest Gleason, MD.  Documentation: I have reviewed the above documentation for accuracy and completeness, and I agree with the above.  Forest Gleason, MD

## 2021-09-26 ENCOUNTER — Encounter: Payer: Self-pay | Admitting: Dermatology

## 2021-10-26 ENCOUNTER — Ambulatory Visit: Payer: 59 | Admitting: Gastroenterology

## 2021-12-01 ENCOUNTER — Encounter (HOSPITAL_COMMUNITY): Payer: Self-pay

## 2021-12-16 ENCOUNTER — Other Ambulatory Visit: Payer: Self-pay | Admitting: Obstetrics and Gynecology

## 2021-12-16 DIAGNOSIS — Z1231 Encounter for screening mammogram for malignant neoplasm of breast: Secondary | ICD-10-CM

## 2022-01-28 ENCOUNTER — Ambulatory Visit
Admission: RE | Admit: 2022-01-28 | Discharge: 2022-01-28 | Disposition: A | Discharge status: Home / Self Care | Payer: 59 | Source: Ambulatory Visit | Attending: Obstetrics and Gynecology | Admitting: Obstetrics and Gynecology

## 2022-01-28 DIAGNOSIS — Z1231 Encounter for screening mammogram for malignant neoplasm of breast: Secondary | ICD-10-CM

## 2022-02-14 ENCOUNTER — Encounter: Payer: Self-pay | Admitting: Gastroenterology

## 2022-02-14 NOTE — H&P (Signed)
Pre-Procedure H&P   Patient ID: [name] is a 56 y.o. female.  Gastroenterology Provider: [name], DO  Referring Provider: Dawson Bills, NP PCP: Merryl Hacker, No  Date: 02/14/2022  HPI [name] is a 56 y.o. female who presents today for Colonoscopy for Ileocolonic Crohn's disease. Patient ileocolonic Crohn's disease diagnosed at 10-81 years old.  She is most recently on Stelara every 8 weeks.  Previous colonoscopy has involve the ileum as well as a majority of the colon.  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  Ulcerations were noted from the a sending through the descending colon as well as some proctitis.  She was also noted to have decreased vascular pattern throughout and sigmoid diverticulosis.  There is also perianal scar tissue suspicious for previous fistula or sinus tract.    She underwent a MR E and October 2022 with some improved TI inflammation She has stopped taking Celebrex.  Last known flares 2021 Nephew with Crohn's disease with no other family history of IBD, CRC or colon polyps  Past Medical History:  Diagnosis Date   Anemia    Crohn's disease ([place])    Polyp of colon     Past Surgical History:  Procedure Laterality Date   ABDOMINAL HYSTERECTOMY     ANTERIOR AND POSTERIOR REPAIR N/A 11/17/2012   Procedure: ANTERIOR (CYSTOCELE) AND POSTERIOR (RECTOCELE), Enterocele repair.;  Surgeon: Lovenia Kim, MD;  Location: [facility];  Service: Gynecology;  Laterality: N/A;  Also Enterocele repair.   BILATERAL SALPINGECTOMY Bilateral 11/17/2012   Procedure: BILATERAL SALPINGECTOMY;  Surgeon: Lovenia Kim, MD;  Location: [facility];  Service: Gynecology;  Laterality: Bilateral;   BLADDER SUSPENSION N/A 11/17/2012   Procedure: TRANSVAGINAL TAPE (TVT) PROCEDURE Sling with Cystoscopy.;  Surgeon: Lovenia Kim, MD;  Location: [facility];  Service: Gynecology;  Laterality: N/A;  TVT Sling with Cystoscopy.    BREAST LUMPECTOMY WITH RADIOACTIVE SEED LOCALIZATION Left 09/05/2020   Procedure: LEFT BREAST LUMPECTOMY WITH RADIOACTIVE SEED LOCALIZATION;  Surgeon: Jovita Kussmaul, MD;  Location: [facility];  Service: General;  Laterality: Left;   BREAST SURGERY     COLONOSCOPY WITH PROPOFOL N/A 08/15/2020   Procedure: COLONOSCOPY WITH PROPOFOL;  Surgeon: Jonathon Bellows, MD;  Location: [facility];  Service: Gastroenterology;  Laterality: N/A;   MANDIBLE SURGERY     ROBOTIC ASSISTED TOTAL HYSTERECTOMY N/A 11/17/2012   Procedure: ROBOTIC ASSISTED TOTAL HYSTERECTOMY;  Surgeon: Lovenia Kim, MD;  Location: [facility];  Service: Gynecology;  Laterality: N/A;  Requests 3 1/2 hrs.    Family History Nephew-Crohn's disease No other h/o GI disease or malignancy  Review of Systems  Constitutional:  Negative for activity change, appetite change, chills, diaphoresis, fatigue, fever and unexpected weight change.  HENT:  Negative for trouble swallowing and voice change.   Respiratory:  Negative for shortness of breath and wheezing.   Cardiovascular:  Negative for chest pain, palpitations and leg swelling.  Gastrointestinal:  Negative for abdominal distention, abdominal pain, anal bleeding, blood in stool, constipation, diarrhea, nausea, rectal pain and vomiting.  Musculoskeletal:  Negative for arthralgias and myalgias.  Skin:  Negative for color change and pallor.  Neurological:  Negative for dizziness, syncope and weakness.  Psychiatric/Behavioral:  Negative for confusion.   All other systems reviewed and are negative.    Medications No current facility-administered medications on file prior to encounter.   Current Outpatient Medications on File Prior to Encounter  Medication Sig Dispense Refill   bimatoprost (LATISSE) 0.03 % ophthalmic solution Place 1 application into both eyes at bedtime. Place one drop on applicator and apply evenly along the skin of the upper eyelid at base of eyelashes once  daily at bedtime; repeat procedure for second eye (use a clean applicator). 5 mL 12   CALCIUM-VITAMIN D PO Take 1 tablet by mouth daily.     Multiple Vitamin (MULTI-VITAMIN) tablet Take by mouth.      Pertinent medications related to GI and procedure were reviewed by me with the patient prior to the procedure  No current facility-administered medications for this encounter.  Current Outpatient Medications:    bimatoprost (LATISSE) 0.03 % ophthalmic solution, Place 1 application into both eyes at bedtime. Place one drop on applicator and apply evenly along the skin of the upper eyelid at base of eyelashes once daily at bedtime; repeat procedure for second eye (use a clean applicator)., Disp: 5 mL, Rfl: 12   CALCIUM-VITAMIN D PO, Take 1 tablet by mouth daily., Disp: , Rfl:    Multiple Vitamin (MULTI-VITAMIN) tablet, Take by mouth., Disp: , Rfl:    ustekinumab (STELARA) 90 MG/ML SOSY injection, Inject into the skin., Disp: , Rfl:       Allergies  Allergen Reactions   Sulfa Antibiotics Anaphylaxis and Swelling   Allergies were reviewed by me prior to the procedure  Objective   There is no height or weight on file to calculate BMI. There were no vitals filed for this visit.  *** Physical Exam Vitals and nursing note reviewed.  Constitutional:      General: She is not in acute distress.    Appearance: Normal appearance. She is not ill-appearing, toxic-appearing or diaphoretic.  HENT:     Head: Normocephalic and atraumatic.     Nose: Nose normal.     Mouth/Throat:     Mouth: Mucous membranes are moist.     Pharynx: Oropharynx is clear.  Eyes:     General: No scleral icterus.    Extraocular Movements: Extraocular movements intact.  Cardiovascular:     Rate and Rhythm: Normal rate and regular rhythm.     Heart sounds: Normal heart sounds. No murmur heard.    No friction rub. No gallop.  Pulmonary:     Effort: Pulmonary effort is normal. No respiratory distress.     Breath  sounds: Normal breath sounds. No wheezing, rhonchi or rales.  Abdominal:     General: Bowel sounds are normal. There is no distension.     Palpations: Abdomen is soft.     Tenderness: There is no abdominal tenderness. There is no guarding or rebound.  Musculoskeletal:     Cervical back: Neck supple.     Right lower leg: No edema.     Left lower leg: No edema.  Skin:    General: Skin is warm and dry.     Coloration: Skin is not jaundiced or pale.  Neurological:     General: No focal deficit present.     Mental Status: She is alert and oriented to person, place, and time. Mental status is at baseline.  Psychiatric:        Mood and Affect: Mood normal.        Behavior: Behavior normal.        Thought Content: Thought content normal.        Judgment: Judgment normal.      Assessment:  [name] is a 56 y.o. female  who presents today for Colonoscopy for Ileocolonic Crohn's disease.  Plan:  Colonoscopy with possible intervention today  Colonoscopy with possible biopsy, control of bleeding, polypectomy, and interventions as necessary has been discussed with the patient/patient representative. Informed consent was obtained from the patient/patient representative after explaining the indication, nature, and risks of the procedure including but not limited to death, bleeding, perforation, missed neoplasm/lesions, cardiorespiratory compromise, and reaction to medications. Opportunity for questions was given and appropriate answers were provided. Patient/patient representative has verbalized understanding is amenable to undergoing the procedure.   [name], DO  [facility]  Portions of the record may have been created with voice recognition software. Occasional wrong-word or 'sound-a-like' substitutions may have occurred due to the inherent limitations of voice recognition software.  Read the chart carefully and recognize, using context, where  substitutions may have occurred.

## 2022-02-15 ENCOUNTER — Ambulatory Visit
Admission: RE | Admit: 2022-02-15 | Discharge: 2022-02-15 | Disposition: A | Discharge status: Home / Self Care | Payer: 59 | Attending: Gastroenterology | Admitting: Gastroenterology

## 2022-02-15 ENCOUNTER — Encounter
Admission: RE | Disposition: A | Discharge status: Home / Self Care | Payer: Self-pay | Source: Home / Self Care | Attending: Gastroenterology

## 2022-02-15 ENCOUNTER — Ambulatory Visit: Payer: 59 | Admitting: Registered Nurse

## 2022-02-15 DIAGNOSIS — K50812 Crohn's disease of both small and large intestine with intestinal obstruction: Secondary | ICD-10-CM | POA: Diagnosis present

## 2022-02-15 DIAGNOSIS — K5730 Diverticulosis of large intestine without perforation or abscess without bleeding: Secondary | ICD-10-CM | POA: Insufficient documentation

## 2022-02-15 HISTORY — PX: COLONOSCOPY: SHX5424

## 2022-02-15 SURGERY — COLONOSCOPY
Anesthesia: General

## 2022-02-15 MED ORDER — PROPOFOL 10 MG/ML IV BOLUS
INTRAVENOUS | Status: DC | PRN
  Administered 2022-02-15: 70 mg via INTRAVENOUS

## 2022-02-15 MED ORDER — LIDOCAINE HCL (PF) 2 % IJ SOLN
INTRAMUSCULAR | Status: AC
  Filled 2022-02-15: qty 20

## 2022-02-15 MED ORDER — GLYCOPYRROLATE 0.2 MG/ML IJ SOLN
INTRAMUSCULAR | Status: AC
  Filled 2022-02-15: qty 1

## 2022-02-15 MED ORDER — PHENYLEPHRINE HCL (PRESSORS) 10 MG/ML IV SOLN: INTRAVENOUS | Status: DC | PRN

## 2022-02-15 MED ORDER — DEXMEDETOMIDINE HCL IN NACL 80 MCG/20ML IV SOLN
INTRAVENOUS | Status: AC
  Filled 2022-02-15: qty 20

## 2022-02-15 MED ORDER — PROPOFOL 1000 MG/100ML IV EMUL
INTRAVENOUS | Status: AC
  Filled 2022-02-15: qty 300

## 2022-02-15 MED ORDER — PHENYLEPHRINE HCL (PRESSORS) 10 MG/ML IV SOLN
INTRAVENOUS | Status: DC | PRN
  Administered 2022-02-15: 160 ug via INTRAVENOUS
  Administered 2022-02-15: 80 ug via INTRAVENOUS
  Administered 2022-02-15: 160 ug via INTRAVENOUS

## 2022-02-15 MED ORDER — EPHEDRINE 5 MG/ML INJ
INTRAVENOUS | Status: AC
  Filled 2022-02-15: qty 5

## 2022-02-15 MED ORDER — LIDOCAINE HCL (CARDIAC) PF 100 MG/5ML IV SOSY
PREFILLED_SYRINGE | INTRAVENOUS | Status: DC | PRN
  Administered 2022-02-15: 60 mg via INTRAVENOUS

## 2022-02-15 MED ORDER — EPHEDRINE SULFATE (PRESSORS) 50 MG/ML IJ SOLN
INTRAMUSCULAR | Status: DC | PRN
  Administered 2022-02-15: 5 mg via INTRAVENOUS

## 2022-02-15 MED ORDER — PROPOFOL 500 MG/50ML IV EMUL
INTRAVENOUS | Status: DC | PRN
  Administered 2022-02-15: 140 ug/kg/min via INTRAVENOUS

## 2022-02-15 MED ORDER — SODIUM CHLORIDE 0.9 % IV SOLN
INTRAVENOUS | Status: DC
  Administered 2022-02-15: 20 mL/h via INTRAVENOUS

## 2022-02-15 NOTE — Op Note (Signed)
[facility] Gastroenterology Patient Name: [name] Procedure Date: 02/15/2022 7:19 AM MRN: [id number] Account #: [id number] Date of Birth: 1965-10-03 Admit Type: Outpatient Age: 56 Room: [facility] ENDO ROOM 2 Gender: Female Note Status: Finalized Instrument Name: Peds Colonoscope [id number] Procedure:             Colonoscopy Indications:           Disease activity assessment of Crohn's disease of the                         colon Providers:             Rueben Bash, DO Referring MD:          Dion Body (Referring MD) Medicines:             Monitored Anesthesia Care Complications:         No immediate complications. Estimated blood loss:                         Minimal. Procedure:             Pre-Anesthesia Assessment:                        - Prior to the procedure, a History and Physical was                         performed, and patient medications and allergies were                         reviewed. The patient is competent. The risks and                         benefits of the procedure and the sedation options and                         risks were discussed with the patient. All questions                         were answered and informed consent was obtained.                         Patient identification and proposed procedure were                         verified by the physician, the nurse, the anesthetist                         and the technician in the endoscopy suite. Mental                         Status Examination: alert and oriented. Airway                         Examination: normal oropharyngeal airway and neck                         mobility. Respiratory Examination: clear to  auscultation. CV Examination: RRR, no murmurs, no S3                         or S4. Prophylactic Antibiotics: The patient does not                         require prophylactic antibiotics. Prior                          Anticoagulants: The patient has taken no previous                         anticoagulant or antiplatelet agents. ASA Grade                         Assessment: II - A patient with mild systemic disease.                         After reviewing the risks and benefits, the patient                         was deemed in satisfactory condition to undergo the                         procedure. The anesthesia plan was to use monitored                         anesthesia care (MAC). Immediately prior to                         administration of medications, the patient was                         re-assessed for adequacy to receive sedatives. The                         heart rate, respiratory rate, oxygen saturations,                         blood pressure, adequacy of pulmonary ventilation, and                         response to care were monitored throughout the                         procedure. The physical status of the patient was                         re-assessed after the procedure.                        After obtaining informed consent, the colonoscope was                         passed under direct vision. Throughout the procedure,                         the patient's blood pressure, pulse, and oxygen  saturations were monitored continuously. The                         Colonoscope was introduced through the anus and                         advanced to the the cecum, identified by appendiceal                         orifice and ileocecal valve. The colonoscopy was                         performed without difficulty. The patient tolerated                         the procedure well. The quality of the bowel                         preparation was evaluated using the BBPS [facility] Bowel                         Preparation Scale) with scores of: Right Colon = 3,                         Transverse Colon = 3 and Left Colon = 3 (entire mucosa                         seen  well with no residual staining, small fragments                         of stool or opaque liquid). The total BBPS score                         equals 9. The ileocecal valve, appendiceal orifice,                         and rectum were photographed. Findings:      The digital rectal exam findings include previous scar tissue from       previous fistula. Pertinent negatives include no signs of active fistula       or abscess.      The ileocecal valve contained a benign-appearing, intrinsic moderate       stenosis measuring less than one cm (in length) x 7 mm (inner diameter)       that was non-traversed. Biopsies were taken with a cold forceps for       histology. Able to visualized through the ICV of terminal ileum, but not       able to traverse. Erosions appreciated and biopsied. Estimated blood       loss was minimal.      The mucosa vascular pattern in the descending colon, in the transverse       colon and in the ascending colon was diffusely decreased. Estimated       blood loss: none.      A few localized non-bleeding erosions were found at the splenic flexure,       in the transverse colon and in the ascending colon. No stigmata of       recent bleeding  were seen. Biopsies were taken with a cold forceps for       histology. Estimated blood loss was minimal. Active disease noted in the       ascending colon to approximately the splenic flexure Biopsies were taken       from areas of disease activity and of normal appearing colonic mucosa -       right, transverse, left colon Estimated blood loss was minimal.      Multiple small-mouthed diverticula were found in the left colon.       Estimated blood loss: none.      Multiple localized non-bleeding erosions were found in the rectum. No       stigmata of recent bleeding were seen. Erythema also noted. Consistent       with proctitis. Biopsies were taken with a cold forceps for histology.       Estimated blood loss was minimal.       The exam was otherwise without abnormality on direct and retroflexion       views. 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 found on                         digital rectal exam.                        - Stricture at the ileocecal valve. Biopsied.                        - Decreased mucosa vascular pattern in the descending                         colon, in the transverse colon and in the ascending                         colon.                        - A few erosions at the splenic flexure, in the                         transverse colon and in the ascending colon. Biopsied.                        - Diverticulosis in the left colon.                        - Multiple erosions in the rectum. Biopsied.                        - The examination was otherwise normal on direct and                         retroflexion views. Recommendation:        - Discharge patient to home.                        - Resume previous diet.                        - Continue present medications.                        -  No aspirin, ibuprofen, naproxen, or other                         non-steroidal anti-inflammatory drugs.                        - Await pathology results.                        - Repeat colonoscopy for surveillance based on                         pathology results.                        - Return to GI office as previously scheduled.                        - Consider budesonide. Consider shortening interval                         between stelara doses from 8 to 6 weeks as there                         appears to be improvement in disease activity from                         previous endoscopy.                        Recommend checking trough drug level and antibody                         prior to next injection.                        Consider addition of immunomodulator.                        - The findings and recommendations were discussed with                          the patient. Procedure Code(s):     --- Professional ---                        [phone], Colonoscopy, flexible; with biopsy, single or                         multiple Diagnosis Code(s):     --- Professional ---                        X32.355, Crohn's disease of large intestine with                         intestinal obstruction                        K63.3, Ulcer of intestine                        K62.6, Ulcer of anus and rectum  K57.30, Diverticulosis of large intestine without                         perforation or abscess without bleeding CPT copyright 2019 American [employer] Association. All rights reserved. The codes documented in this report are preliminary and upon coder review may  be revised to meet current compliance requirements. Attending Participation:      I personally performed the entire procedure. Volney American, DO [name] DO, DO 02/15/2022 8:27:13 AM This report has been signed electronically. Number of Addenda: 0 Note Initiated On: 02/15/2022 7:19 AM Scope Withdrawal Time: 0 hours 21 minutes 11 seconds  Total Procedure Duration: 0 hours 25 minutes 9 seconds  Estimated Blood Loss:  Estimated blood loss was minimal.      [facility]

## 2022-02-15 NOTE — Anesthesia Postprocedure Evaluation (Signed)
Anesthesia Post Note  Patient: [name]  Procedure(s) Performed: COLONOSCOPY  Patient location during evaluation: Endoscopy Anesthesia Type: General Level of consciousness: awake and alert Pain management: pain level controlled Vital Signs Assessment: post-procedure vital signs reviewed and stable Respiratory status: spontaneous breathing, nonlabored ventilation, respiratory function stable and patient connected to nasal cannula oxygen Cardiovascular status: blood pressure returned to baseline and stable Postop Assessment: no apparent nausea or vomiting Anesthetic complications: no   No notable events documented.   Last Vitals:  Vitals:   02/15/22 0829 02/15/22 0838  BP: (!) 97/50 110/73  Pulse: 67 66  Resp: 16 13  Temp:    SpO2: 100% 100%    Last Pain:  Vitals:   02/15/22 0838  TempSrc:   PainSc: 0-No pain                 Precious Haws Georgios Kina

## 2022-02-15 NOTE — Transfer of Care (Signed)
Immediate Anesthesia Transfer of Care Note  Patient: JOZETTE CASTRELLON  Procedure(s) Performed: COLONOSCOPY  Patient Location: PACU  Anesthesia Type:General  Level of Consciousness: awake, alert  and oriented  Airway & Oxygen Therapy: Patient Spontanous Breathing  Post-op Assessment: Report given to RN and Post -op Vital signs reviewed and stable  Post vital signs: Reviewed and stable  Last Vitals:  Vitals Value Taken Time  BP 109/51 02/15/22 0818  Temp 36.2 C 02/15/22 0818  Pulse 77 02/15/22 0818  Resp 21 02/15/22 0818  SpO2 100 % 02/15/22 0818    Last Pain:  Vitals:   02/15/22 0818  TempSrc: Temporal  PainSc: Asleep         Complications: No notable events documented.

## 2022-02-15 NOTE — Interval H&P Note (Signed)
History and Physical Interval Note: Preprocedure H&P from 02/15/22  was reviewed and there was no interval change after seeing and examining the patient.  Written consent was obtained from the patient after discussion of risks, benefits, and alternatives. Patient has consented to proceed with Colonoscopy with possible intervention   02/15/2022 8:35 AM  [name]  has presented today for surgery, with the diagnosis of Crohn's disease of large intestine without complication (CMS-HCC) (Q82.50).  The various methods of treatment have been discussed with the patient and family. After consideration of risks, benefits and other options for treatment, the patient has consented to  Procedure(s): COLONOSCOPY (N/A) as a surgical intervention.  The patient's history has been reviewed, patient examined, no change in status, stable for surgery.  I have reviewed the patient's chart and labs.  Questions were answered to the patient's satisfaction.     [name]

## 2022-02-15 NOTE — Anesthesia Preprocedure Evaluation (Signed)
Anesthesia Evaluation  Patient identified by MRN, date of birth, ID band Patient awake    Reviewed: Allergy & Precautions, NPO status , Patient's Chart, lab work & pertinent test results  History of Anesthesia Complications Negative for: history of anesthetic complications  Airway Mallampati: III  TM Distance: <3 FB Neck ROM: full    Dental  (+) Chipped   Pulmonary neg pulmonary ROS, neg shortness of breath,    Pulmonary exam normal        Cardiovascular Exercise Tolerance: Good (-) angina(-) Past MI and (-) DOE negative cardio ROS Normal cardiovascular exam     Neuro/Psych  Neuromuscular disease negative psych ROS   GI/Hepatic negative GI ROS, Neg liver ROS, neg GERD  ,  Endo/Other  negative endocrine ROS  Renal/GU negative Renal ROS  negative genitourinary   Musculoskeletal   Abdominal   Peds  Hematology negative hematology ROS (+)   Anesthesia Other Findings Past Medical History: No date: Anemia No date: Crohn's disease ([place]) No date: Polyp of colon  Past Surgical History: No date: ABDOMINAL HYSTERECTOMY 11/17/2012: ANTERIOR AND POSTERIOR REPAIR; N/A     Comment:  Procedure: ANTERIOR (CYSTOCELE) AND POSTERIOR               (RECTOCELE), Enterocele repair.;  Surgeon: Lovenia Kim, MD;  Location: [facility];  Service: Gynecology;                Laterality: N/A;  Also Enterocele repair. 11/17/2012: BILATERAL SALPINGECTOMY; Bilateral     Comment:  Procedure: BILATERAL SALPINGECTOMY;  Surgeon: Lovenia Kim, MD;  Location: [facility];  Service: Gynecology;                Laterality: Bilateral; 11/17/2012: BLADDER SUSPENSION; N/A     Comment:  Procedure: TRANSVAGINAL TAPE (TVT) PROCEDURE Sling with               Cystoscopy.;  Surgeon: Lovenia Kim, MD;  Location:               [facility];  Service: Gynecology;  Laterality: N/A;  TVT               Sling with Cystoscopy. 09/05/2020:  BREAST LUMPECTOMY WITH RADIOACTIVE SEED LOCALIZATION; Left     Comment:  Procedure: LEFT BREAST LUMPECTOMY WITH RADIOACTIVE SEED               LOCALIZATION;  Surgeon: Jovita Kussmaul, MD;  Location:               [facility];  Service: General;                Laterality: Left; No date: BREAST SURGERY 08/15/2020: COLONOSCOPY WITH PROPOFOL; N/A     Comment:  Procedure: COLONOSCOPY WITH PROPOFOL;  Surgeon: Jonathon Bellows, MD;  Location: [facility];  Service:               Gastroenterology;  Laterality: N/A; No date: MANDIBLE SURGERY 11/17/2012: ROBOTIC ASSISTED TOTAL HYSTERECTOMY; N/A     Comment:  Procedure: ROBOTIC ASSISTED TOTAL HYSTERECTOMY;                Surgeon: Lovenia Kim, MD;  Location: [facility];  Service: Gynecology;  Laterality: N/A;  Requests 3 1/2               hrs.  BMI    Body Mass Index: 26.79 kg/m      Reproductive/Obstetrics negative OB ROS                             Anesthesia Physical Anesthesia Plan  ASA: 2  Anesthesia Plan: General   Post-op Pain Management:    Induction: Intravenous  PONV Risk Score and Plan: Propofol infusion and TIVA  Airway Management Planned: Natural Airway and Nasal Cannula  Additional Equipment:   Intra-op Plan:   Post-operative Plan:   Informed Consent: I have reviewed the patients History and Physical, chart, labs and discussed the procedure including the risks, benefits and alternatives for the proposed anesthesia with the patient or authorized representative who has indicated his/her understanding and acceptance.     Dental Advisory Given  Plan Discussed with: Anesthesiologist, CRNA and Surgeon  Anesthesia Plan Comments: (Patient consented for risks of anesthesia including but not limited to:  - adverse reactions to medications - risk of airway placement if required - damage to eyes, teeth, lips or other oral mucosa - nerve damage due to  positioning  - sore throat or hoarseness - Damage to heart, brain, nerves, lungs, other parts of body or loss of life  Patient voiced understanding.)        Anesthesia Quick Evaluation

## 2022-02-16 ENCOUNTER — Encounter: Payer: Self-pay | Admitting: Gastroenterology

## 2022-02-16 LAB — SURGICAL PATHOLOGY

## 2022-03-30 ENCOUNTER — Other Ambulatory Visit: Payer: Self-pay | Admitting: Dermatology

## 2022-03-30 DIAGNOSIS — L659 Nonscarring hair loss, unspecified: Secondary | ICD-10-CM

## 2022-06-10 ENCOUNTER — Other Ambulatory Visit: Payer: Self-pay | Admitting: Nurse Practitioner

## 2022-06-10 DIAGNOSIS — K6389 Other specified diseases of intestine: Secondary | ICD-10-CM

## 2022-06-10 DIAGNOSIS — K50813 Crohn's disease of both small and large intestine with fistula: Secondary | ICD-10-CM

## 2022-06-18 ENCOUNTER — Ambulatory Visit
Admission: RE | Admit: 2022-06-18 | Discharge: 2022-06-18 | Disposition: A | Discharge status: Home / Self Care | Payer: 59 | Source: Ambulatory Visit | Attending: Nurse Practitioner | Admitting: Nurse Practitioner

## 2022-06-18 DIAGNOSIS — K50813 Crohn's disease of both small and large intestine with fistula: Secondary | ICD-10-CM

## 2022-06-18 DIAGNOSIS — K6389 Other specified diseases of intestine: Secondary | ICD-10-CM | POA: Insufficient documentation

## 2022-06-18 MED ORDER — GADOBUTROL 1 MMOL/ML IV SOLN
7.0000 mL | Freq: Once | INTRAVENOUS | Status: AC | PRN
  Administered 2022-06-18: 7 mL via INTRAVENOUS

## 2022-07-22 ENCOUNTER — Ambulatory Visit: Payer: 59 | Admitting: Endocrinology

## 2022-08-17 ENCOUNTER — Ambulatory Visit (INDEPENDENT_AMBULATORY_CARE_PROVIDER_SITE_OTHER): Payer: 59 | Admitting: Dermatology

## 2022-08-17 DIAGNOSIS — R21 Rash and other nonspecific skin eruption: Secondary | ICD-10-CM | POA: Diagnosis not present

## 2022-08-17 MED ORDER — PREDNISONE 5 MG PO TABS: ORAL_TABLET | ORAL | 0 refills | Status: AC

## 2022-08-17 NOTE — Progress Notes (Signed)
   Follow-Up Visit   Subjective  [name] is a 57 y.o. female who presents for the following: Rash.  Patient presents for rash that started on the face Monday morning (yesterday). Since then has spread more on face, arms, hands, chest. Itchy. No new products, no new medicines, no recent travel. She takes Stelara for Crohn's, has been taking for 1 year. After Christmas, she felt like she was getting a cold with a little congestion, but improved now. No h/o recent cold sores.  Last Stelara injection was 12/24.  The following portions of the chart were reviewed this encounter and updated as appropriate:       Review of Systems:  No other skin or systemic complaints except as noted in HPI or Assessment and Plan.  Objective  Well appearing patient in no apparent distress; mood and affect are within normal limits.  A focused examination was performed including face, chest, arms. Relevant physical exam findings are noted in the Assessment and Plan.  face, arms, chest Edematous pink papules on the arms, cheeks, forehead, neck, chest some with central darkening/tiny erosion, some with central lightening.  Few light pink colored macules L palm.  Edema of lower lip.  No oral ulcerations.               Assessment & Plan  Rash and other nonspecific skin eruption face, arms, chest  Suspicious for Erythema Multiforme due to recent viral illness  Start Prednisone '5mg'$  tablets take po x 2 weeks as directed. Take prednisone by mouth daily in morning with food starting at 60 mg dosage and gradually decreasing daily dose as directed until gone for a two week taper.  Patient was given written instructions.  Potential side effects reviewed.  Risks of prednisone taper discussed including mood irritability, insomnia, weight gain, stomach ulcers, increased risk of infection, increased blood sugar (diabetes), hypertension, osteoporosis with long-term or frequent use, and rare risk of avascular  necrosis of the hip.   If not improving by 1 week f/up, will biopsy   predniSONE (DELTASONE) 5 MG tablet - face, arms, chest Take tablets by mouth every morning as directed. Patient has detailed instruction sheet.   Return in about 1 week (around 08/24/2022) for f/u rash.  IJamesetta Orleans, CMA, am acting as scribe for Brendolyn Patty, MD .  Documentation: I have reviewed the above documentation for accuracy and completeness, and I agree with the above.  Brendolyn Patty MD

## 2022-08-17 NOTE — Patient Instructions (Addendum)
2 Week Prednisone Taper  You will be given a prescription for 100 tablets of oral Prednisone. It is very important that you take this according to the exact schedule provided below. This type of regimen for taking medication is often called a "taper", because your dosage will steadily decrease over a two week period until it is discontinued altogether.  ALWAYS take this medicine with food to prevent it from irritating your stomach. You should also take your Prednisone during morning hours.  Call the clinic at [phone] if you gain more than two pounds in one day, notice swelling anywhere on your body, have shortness of breath, black or red bowel movements, brown or red vomitus, desire to drink large amounts of fluids, a fever, or extreme weakness.   Oral Prednisone over Two Weeks  Day  Week 1  Week '2   1  12 '$ tablets  7 tablets   2  12 tablets  6 tablets   3  11 tablets  5 tablets   4  10 tablets  4 tablets   5  10 tablets  3 tablets   6  9 tablets  2 tablets   7  8 tablets  1 tablet   Risks of prednisone taper discussed including mood irritability, insomnia, weight gain, stomach ulcers, increased risk of infection, increased blood sugar (diabetes), hypertension, osteoporosis with long-term or frequent use, and rare risk of avascular necrosis of the hip.   Recommend OTC Gold Bond Rapid Relief Anti-Itch cream (pramoxine + menthol), CeraVe Anti-itch cream or lotion (pramoxine), Sarna lotion (Original- menthol + camphor or Sensitive- pramoxine) or Eucerin 12 hour Itch Relief lotion (menthol) up to 3 times per day to areas on body that are itchy.  Due to recent changes in healthcare laws, you may see results of your pathology and/or laboratory studies on MyChart before the doctors have had a chance to review them. We understand that in some cases there may be results that are confusing or concerning to you. Please understand that not all results are received at the same time and often the  doctors may need to interpret multiple results in order to provide you with the best plan of care or course of treatment. Therefore, we ask that you please give [place] 2 business days to thoroughly review all your results before contacting the office for clarification. Should we see a critical lab result, you will be contacted sooner.   If You Need Anything After Your Visit  If you have any questions or concerns for your doctor, please call our main line at [phone] and press option 4 to reach your doctor's medical assistant. If no one answers, please leave a voicemail as directed and we will return your call as soon as possible. Messages left after 4 pm will be answered the following business day.   You may also send [place] a message via [place]. We typically respond to MyChart messages within 1-2 business days.  For prescription refills, please ask your pharmacy to contact our office. Our fax number is [phone].  If you have an urgent issue when the clinic is closed that cannot wait until the next business day, you can page your doctor at the number below.    Please note that while we do our best to be available for urgent issues outside of office hours, we are not available 24/7.   If you have an urgent issue and are unable to reach [place], you may choose to seek medical care at  your doctor's office, retail clinic, urgent care center, or emergency room.  If you have a medical emergency, please immediately call 911 or go to the emergency department.  Pager Numbers  - Dr. Nehemiah Massed: [phone]  - Dr. Laurence Ferrari: [phone]  - Dr. Nicole Kindred: [phone]  In the event of inclement weather, please call our main line at [phone] for an update on the status of any delays or closures.  Dermatology Medication Tips: Please keep the boxes that topical medications come in in order to help keep track of the instructions about where and how to use these. Pharmacies typically print the medication  instructions only on the boxes and not directly on the medication tubes.   If your medication is too expensive, please contact our office at [phone] option 4 or send [place] a message through [place].   We are unable to tell what your co-pay for medications will be in advance as this is different depending on your insurance coverage. However, we may be able to find a substitute medication at lower cost or fill out paperwork to get insurance to cover a needed medication.   If a prior authorization is required to get your medication covered by your insurance company, please allow [place] 1-2 business days to complete this process.  Drug prices often vary depending on where the prescription is filled and some pharmacies may offer cheaper prices.  The website www.goodrx.com contains coupons for medications through different pharmacies. The prices here do not account for what the cost may be with help from insurance (it may be cheaper with your insurance), but the website can give you the price if you did not use any insurance.  - You can print the associated coupon and take it with your prescription to the pharmacy.  - You may also stop by our office during regular business hours and pick up a GoodRx coupon card.  - If you need your prescription sent electronically to a different pharmacy, notify our office through [facility] or by phone at [phone] option 4.     Si Usted Necesita Algo Despus de Su Visita  Tambin puede enviarnos un mensaje a travs de Pharmacist, community. Por lo general respondemos a los mensajes de MyChart en el transcurso de 1 a 2 das hbiles.  Para renovar recetas, por favor pida a su farmacia que se ponga en contacto con nuestra oficina. Harland Dingwall de fax es [place] [phone].  Si tiene un asunto urgente cuando la clnica est cerrada y que no puede esperar hasta el siguiente da hbil, puede llamar/localizar a su doctor(a) al nmero que aparece a continuacin.   Por  favor, tenga en cuenta que aunque hacemos todo lo posible para estar disponibles para asuntos urgentes fuera del horario de [place], no estamos disponibles las 24 horas del da, los 7 das de la [place].   Si tiene un problema urgente y no puede comunicarse con nosotros, puede optar por buscar atencin mdica  en el consultorio de su doctor(a), en una clnica privada, en un centro de atencin urgente o en una sala de emergencias.  Si tiene Engineering geologist, por favor llame inmediatamente al 911 o vaya a la sala de emergencias.  Nmeros de bper  - Dr. Nehemiah Massed: [phone]  - Dra. Moye: [phone]  - Dra. Nicole Kindred: [phone]  En caso de inclemencias del [place], por favor llame a Johnsie Kindred principal al [phone] para una actualizacin sobre el [place] de cualquier retraso o cierre.  Consejos para la medicacin en dermatologa:  Por favor, guarde las cajas en las que vienen los medicamentos de uso tpico para ayudarle a seguir las instrucciones sobre dnde y cmo usarlos. Las farmacias generalmente imprimen las instrucciones del medicamento slo en las cajas y no directamente en los tubos del [place].   Si su medicamento es muy caro, por favor, pngase en contacto con Zigmund Daniel llamando al [phone] y presione la opcin 4 o envenos un mensaje a travs de Pharmacist, community.   No podemos decirle cul ser su copago por los medicamentos por adelantado ya que esto es diferente dependiendo de la cobertura de su seguro. Sin embargo, es posible que podamos encontrar un medicamento sustituto a Electrical engineer un formulario para que el seguro cubra el medicamento que se considera necesario.   Si se requiere una autorizacin previa para que su compaa de seguros [place] su medicamento, por favor permtanos de 1 a 2 das hbiles para completar este proceso.  Los precios de los medicamentos varan con frecuencia dependiendo del Environmental consultant de dnde se surte la receta y alguna farmacias  pueden ofrecer precios ms baratos.  El sitio web www.goodrx.com tiene cupones para medicamentos de Airline pilot. Los precios aqu no tienen en cuenta lo que podra costar con la ayuda del seguro (puede ser ms barato con su seguro), pero el sitio web puede darle el precio si no utiliz Research scientist (physical sciences).  - Puede imprimir el cupn correspondiente y llevarlo con su receta a la farmacia.  - Tambin puede pasar por nuestra oficina durante el horario de atencin regular y Charity fundraiser una tarjeta de cupones de GoodRx.  - Si necesita que su receta se enve electrnicamente a una farmacia diferente, informe a nuestra oficina a travs de MyChart de [place] o por telfono llamando al [phone] y presione la opcin 4.

## 2022-08-25 ENCOUNTER — Ambulatory Visit: Payer: 59 | Admitting: Dermatology

## 2022-10-21 IMAGING — MR MR [PERSON_NAME] ABD W/CM
15 series · 48 of 48 positions shown · IV contrast (7ml Gadavist)
Comparison: None.

CLINICAL DATA: History of Crohn's disease, recent flare, cramping
and diarrhea

EXAM:
MR ABDOMEN AND PELVIS WITHOUT AND WITH CONTRAST (MR ENTEROGRAPHY)
TECHNIQUE: Multiplanar, multisequence MRI of the abdomen and pelvis was
performed both before and during bolus administration of intravenous
contrast. Negative oral contrast Lenin was given.
CONTRAST:  7mL GADAVIST GADOBUTROL 1 MMOL/ML IV SOLN

[Series 6: t2_haste_tra_p2_mbh_comp · axial · 5.0mm · 1.25mm/px · z∈[-231,+184]mm · 3 of 84 slices shown]
[im 1/84]
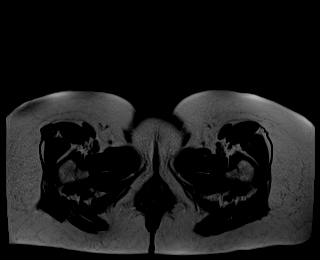
[im 42/84]
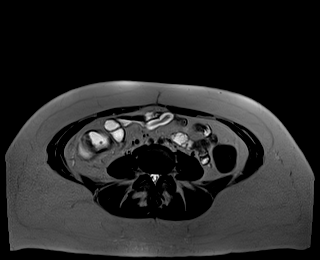
[im 84/84]
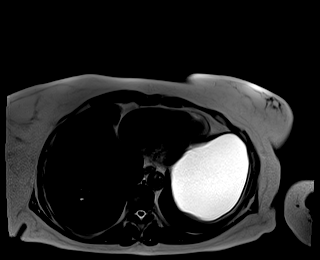

[Series 7: cor haste mbh · coronal · 5.0mm · 0.88mm/px · 2 of 41 slices shown]
[im 1/41]
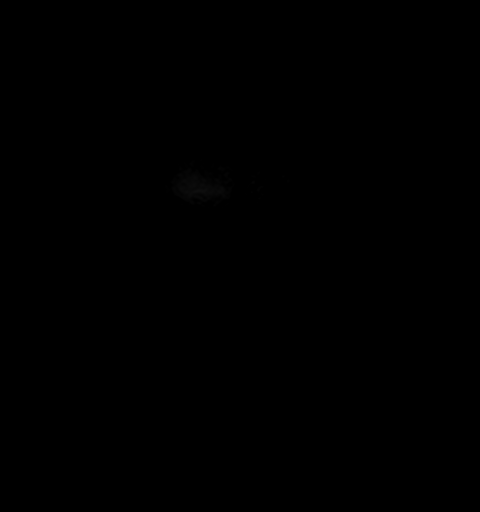
[im 41/41]
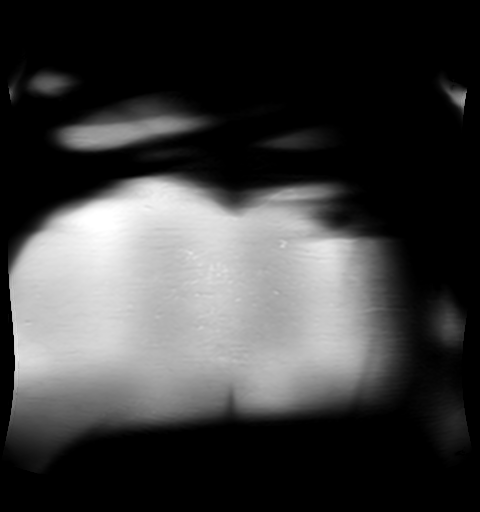

[Series 12: ax dwi_adc_comp · axial · 5.0mm · 1.49mm/px · z∈[-238,+177]mm · 3 of 84 slices shown]
[im 1/84]
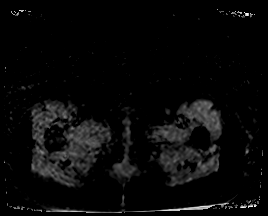
[im 42/84]
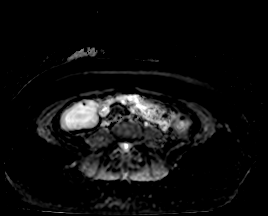
[im 84/84]
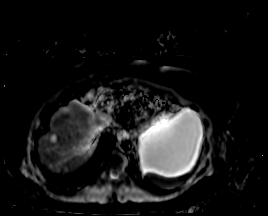

[Series 13: ax dwi_tracew_comp · axial · 5.0mm · 1.49mm/px · z∈[-238,+177]mm · 8 of 252 slices shown]
[im 1/252]
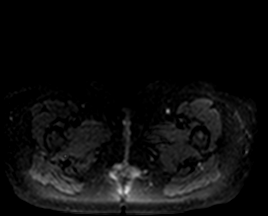
[im 36/252]
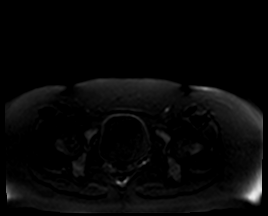
[im 72/252]
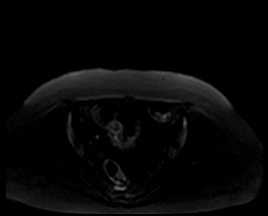
[im 108/252]
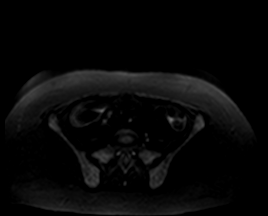
[im 144/252]
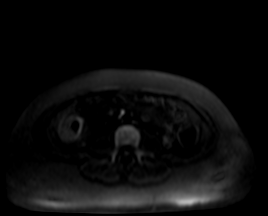
[im 180/252]
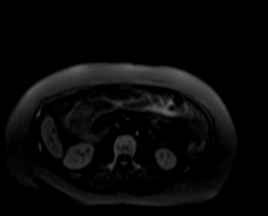
[im 216/252]
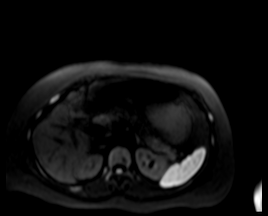
[im 252/252]
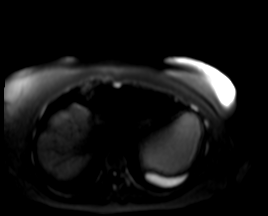

[Series 14: cor true fisp · coronal · 5.0mm · 0.70mm/px · 2 of 41 slices shown]
[im 1/41]
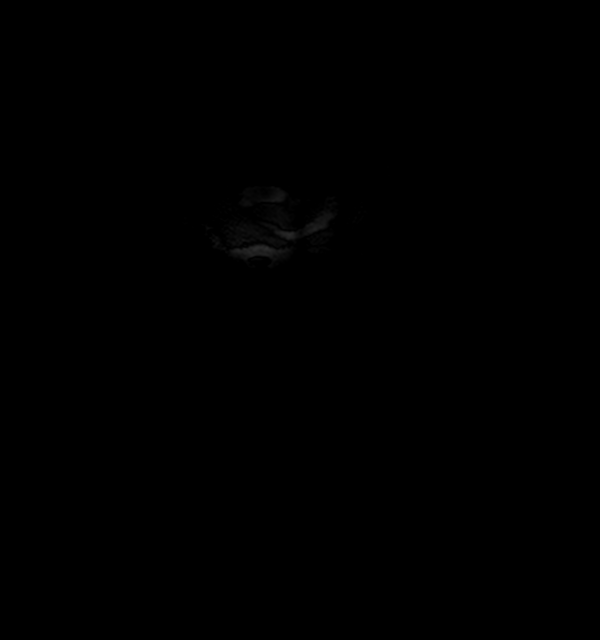
[im 41/41]
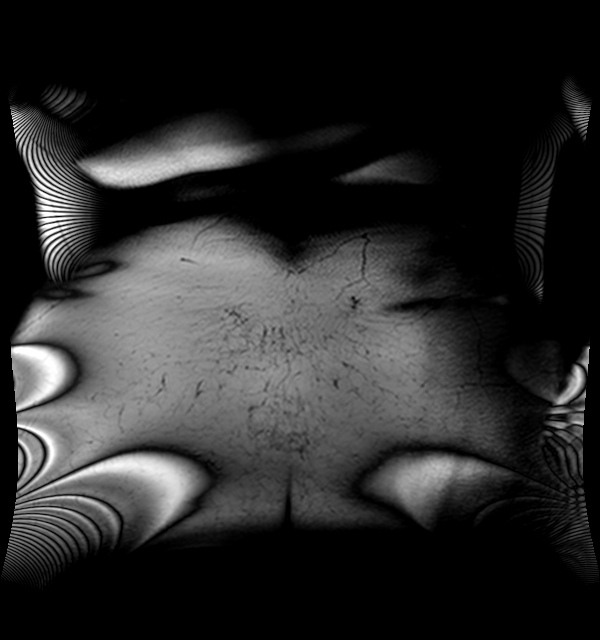

[Series 15: T1 dynamic · axial · 3.1mm · 1.64mm/px · z∈[-212,+181]mm · 3 of 128 slices shown (1 of 10)]
[im 1/128]
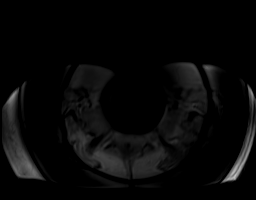
[im 64/128]
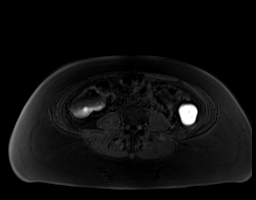
[im 128/128]
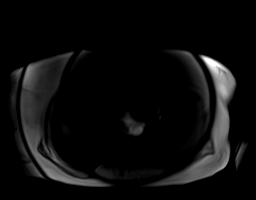

[Series 16: T1 dynamic · axial · 3.1mm · 1.64mm/px · z∈[-212,+181]mm · 3 of 128 slices shown (2 of 10)]
[im 1/128]
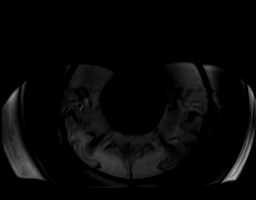
[im 64/128]
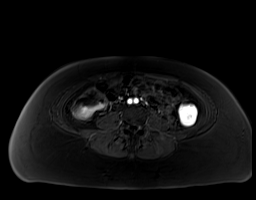
[im 128/128]
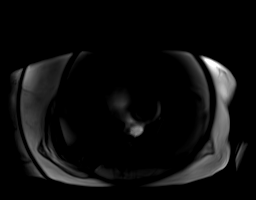

[Series 17: T1 dynamic · axial · 3.1mm · 1.64mm/px · z∈[-212,+181]mm · 3 of 128 slices shown (3 of 10)]
[im 1/128]
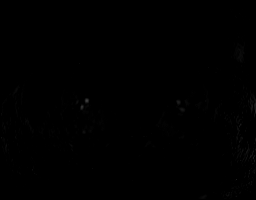
[im 64/128]
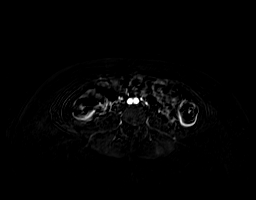
[im 128/128]
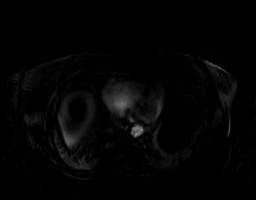

[Series 18: T1 dynamic · axial · 3.1mm · 1.64mm/px · z∈[-212,+181]mm · 3 of 128 slices shown (4 of 10)]
[im 1/128]
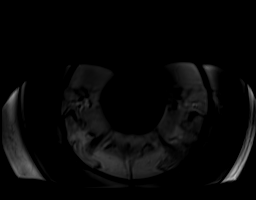
[im 64/128]
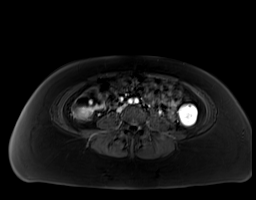
[im 128/128]
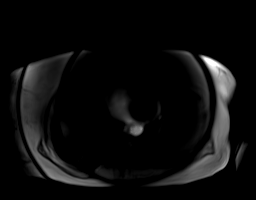

[Series 19: T1 dynamic · axial · 3.1mm · 1.64mm/px · z∈[-212,+181]mm · 3 of 128 slices shown (5 of 10)]
[im 1/128]
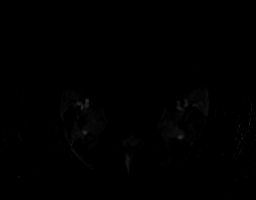
[im 64/128]
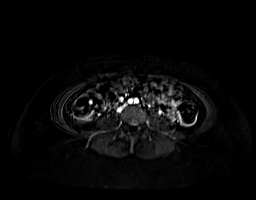
[im 128/128]
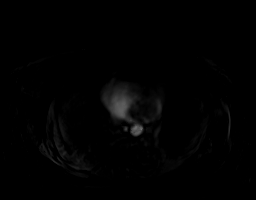

[Series 20: T1 dynamic · axial · 3.1mm · 1.64mm/px · z∈[-212,+181]mm · 3 of 128 slices shown (6 of 10)]
[im 1/128]
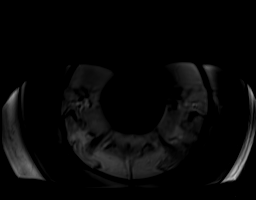
[im 64/128]
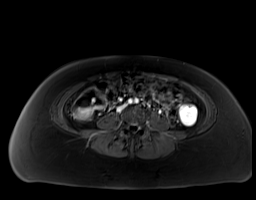
[im 128/128]
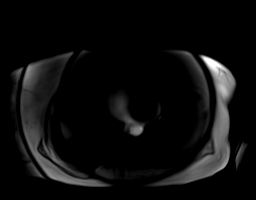

[Series 21: T1 dynamic · axial · 3.1mm · 1.64mm/px · z∈[-212,+181]mm · 3 of 128 slices shown (7 of 10)]
[im 1/128]
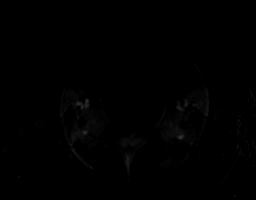
[im 64/128]
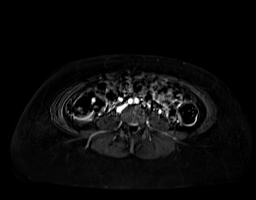
[im 128/128]
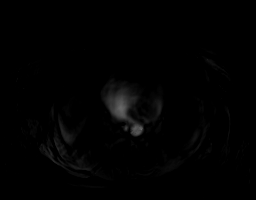

[Series 23: T1 dynamic · coronal · 1.6mm · 1.76mm/px · 3 of 128 slices shown (8 of 10)]
[im 1/128]
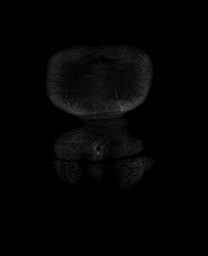
[im 64/128]
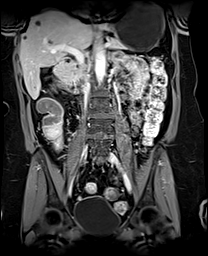
[im 128/128]
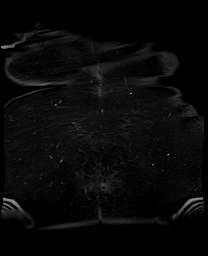

[Series 24: T1 dynamic · axial · 3.1mm · 1.64mm/px · z∈[-212,+181]mm · 3 of 128 slices shown (9 of 10)]
[im 1/128]
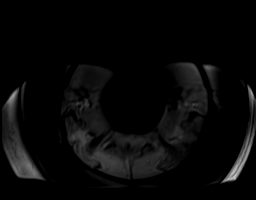
[im 64/128]
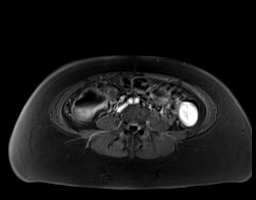
[im 128/128]
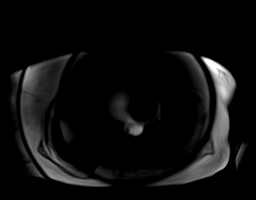

[Series 25: T1 dynamic · axial · 3.1mm · 1.64mm/px · z∈[-212,+181]mm · 3 of 128 slices shown (10 of 10)]
[im 1/128]
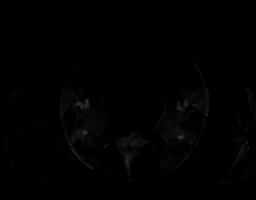
[im 64/128]
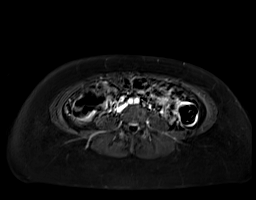
[im 128/128]
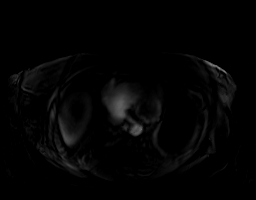

[48 of 48 positions shown; findings below may reference images not displayed]

FINDINGS: COMBINED FINDINGS FOR BOTH MR ABDOMEN AND PELVIS

Lower chest: No acute findings.

Hepatobiliary: There are multiple fluid signal, nonenhancing cysts
throughout the liver. No mass or other parenchymal abnormality
identified.

Pancreas: No mass, inflammatory changes, or other parenchymal
abnormality identified.

Spleen:  Within normal limits in size and appearance.

Adrenals/Urinary Tract: No masses identified. No evidence of
hydronephrosis.

Stomach/Bowel: Evaluation of the small bowel is significantly
limited by peristalsis and breath motion artifact. Within this
limitation, there appears to be some wall enhancement and narrowing
of the terminal ileum, involving approximately the terminal 6 cm
(series 21, image 98). No overt evidence of stricture. There is no
evidence of occasional sigmoid diverticula.

Vascular/Lymphatic: No pathologically enlarged lymph nodes
identified. No abdominal aortic aneurysm demonstrated.

Reproductive: Uterus and bilateral adnexa are unremarkable.

Other:  None.

Musculoskeletal: No suspicious bone lesions identified.
IMPRESSION: 1. Evaluation of the small bowel is significantly limited by
peristalsis and breath motion artifact. Within this limitation,
there appears to be some wall enhancement and narrowing of the
terminal ileum, involving approximately 6 cm. No overt evidence of
stricture or bowel obstruction.
2. No other abnormal inflammatory findings of the bowel or elsewhere
in the abdomen. Given significant technical limitations of this
examination, CT enterography may be helpful for better evaluation.

## 2022-12-16 ENCOUNTER — Other Ambulatory Visit: Payer: Self-pay | Admitting: Obstetrics and Gynecology

## 2022-12-16 DIAGNOSIS — Z1231 Encounter for screening mammogram for malignant neoplasm of breast: Secondary | ICD-10-CM

## 2022-12-22 ENCOUNTER — Ambulatory Visit (INDEPENDENT_AMBULATORY_CARE_PROVIDER_SITE_OTHER): Payer: Self-pay | Admitting: Dermatology

## 2022-12-22 ENCOUNTER — Encounter: Payer: Self-pay | Admitting: Dermatology

## 2022-12-22 DIAGNOSIS — L811 Chloasma: Secondary | ICD-10-CM

## 2022-12-22 DIAGNOSIS — L988 Other specified disorders of the skin and subcutaneous tissue: Secondary | ICD-10-CM

## 2022-12-22 DIAGNOSIS — L659 Nonscarring hair loss, unspecified: Secondary | ICD-10-CM

## 2022-12-22 MED ORDER — BIMATOPROST 0.03 % EX SOLN: 1.0000 "application " | Freq: Every day | CUTANEOUS | 12 refills | Status: AC

## 2022-12-22 NOTE — Patient Instructions (Addendum)
Recommend taking Heliocare sun protection supplement daily in sunny weather for additional sun protection. For maximum protection on the sunniest days, you can take up to 2 capsules of regular Heliocare OR take 1 capsule of Heliocare Ultra. For prolonged exposure (such as a full day in the sun), you can repeat your dose of the supplement 4 hours after your first dose. Heliocare can be purchased at [employer], at some [facility] or at [email or web address].     Will prescribe [facility] Hydroquinone 12%/kojic acid/vitamin C cream pea sized amount twice daily to the entire face for up to 3 months. This cannot be used more than 3 months due to risk of exogenous ochronosis (permanent dark spots). The patient was advised this is not covered by insurance since it is made by a compounding pharmacy. They will receive an email to check out and the medication will be mailed to their home.   Will prescribe [facility] Anti-Aging Tretinoin 0.0125%/Niacinamide/Vitamin C/Vitamin E/Turmeric/Resveratrol with Hyaluronic Acid. Apply pea sized amount nightly to the entire face.  The patient was advised this is not covered by insurance since it is made by a compounding pharmacy. They will receive an email to check out and the medication will be mailed to their home.   Topical retinoid medications like tretinoin can cause dryness and irritation when first started. Only apply a pea-sized amount to the entire affected area. Avoid applying it around the eyes, edges of mouth and creases at the nose. If you experience irritation, use a good moisturizer first and/or apply the medicine less often. If you are doing well with the medicine, you can increase how often you use it until you are applying every night. Be careful with sun protection while using this medication as it can make you sensitive to the sun. This medicine should not be used by pregnant women.   Instructions for [facility] Medications  One or more  of your medications was sent to the [facility] mail order compounding pharmacy. You will receive an email from them and can purchase the medicine through that link. It will then be mailed to your home at the address you confirmed. If for any reason you do not receive an email from them, please check your spam folder. If you still do not find the email, please let us know. [facility] phone number is [phone].  Bimatoprost (latisse) can rarely cause permanent darkening of the iris (colored part) of the eye. It can also irritation or darkening of the skin of the eyelids. It can also cause loss of fat in the fat pad around the eye appears to be mostly reversible if the medication is stopped. It is not recommended during pregnancy.  Due to recent changes in healthcare laws, you may see results of your pathology and/or laboratory studies on MyChart before the doctors have had a chance to review them. We understand that in some cases there may be results that are confusing or concerning to you. Please understand that not all results are received at the same time and often the doctors may need to interpret multiple results in order to provide you with the best plan of care or course of treatment. Therefore, we ask that you please give [place] 2 business days to thoroughly review all your results before contacting the office for clarification. Should we see a critical lab result, you will be contacted sooner.   If You Need Anything After Your Visit  If you have any questions or concerns  for your doctor, please call our main line at [phone] and press option 4 to reach your doctor's medical assistant. If no one answers, please leave a voicemail as directed and we will return your call as soon as possible. Messages left after 4 pm will be answered the following business day.   You may also send [place] a message via MyChart. We typically respond to MyChart messages within 1-2 business days.  For  prescription refills, please ask your pharmacy to contact our office. Our fax number is [phone].  If you have an urgent issue when the clinic is closed that cannot wait until the next business day, you can page your doctor at the number below.    Please note that while we do our best to be available for urgent issues outside of office hours, we are not available 24/7.   If you have an urgent issue and are unable to reach [place], you may choose to seek medical care at your doctor's office, retail clinic, urgent care center, or emergency room.  If you have a medical emergency, please immediately call 911 or go to the emergency department.  Pager Numbers  - Dr. Gwen Pounds: [phone]  - Dr. Neale Burly: [phone]  - Dr. Roseanne Reno: [phone]  In the event of inclement weather, please call our main line at [phone] for an update on the status of any delays or closures.  Dermatology Medication Tips: Please keep the boxes that topical medications come in in order to help keep track of the instructions about where and how to use these. Pharmacies typically print the medication instructions only on the boxes and not directly on the medication tubes.   If your medication is too expensive, please contact our office at [phone] option 4 or send [place] a message through MyChart.   We are unable to tell what your co-pay for medications will be in advance as this is different depending on your insurance coverage. However, we may be able to find a substitute medication at lower cost or fill out paperwork to get insurance to cover a needed medication.   If a prior authorization is required to get your medication covered by your insurance company, please allow [place] 1-2 business days to complete this process.  Drug prices often vary depending on where the prescription is filled and some pharmacies may offer cheaper prices.  The website www.goodrx.com contains coupons for medications through different  pharmacies. The prices here do not account for what the cost may be with help from insurance (it may be cheaper with your insurance), but the website can give you the price if you did not use any insurance.  - You can print the associated coupon and take it with your prescription to the pharmacy.  - You may also stop by our office during regular business hours and pick up a GoodRx coupon card.  - If you need your prescription sent electronically to a different pharmacy, notify our office through [facility] or by phone at [phone] option 4.

## 2022-12-22 NOTE — Progress Notes (Signed)
Follow-Up Visit   Subjective  [name] is a 57 y.o. female who presents for the following: Botox injections for facial elastosis  She is also bothered by dark spots at her face. She also requests a Latisse refill.   The following portions of the chart were reviewed this encounter and updated as appropriate: medications, allergies, medical history  Review of Systems:  No other skin or systemic complaints except as noted in HPI or Assessment and Plan.  Objective  Well appearing patient in no apparent distress; mood and affect are within normal limits.   A focused examination was performed of the following areas: face  Relevant exam findings are noted in the Assessment and Plan.    Assessment & Plan   FACIAL ELASTOSIS Exam: Rhytides and volume loss.  Treatment Plan:  Will prescribe [facility] Anti-Aging Tretinoin 0.0125%/Niacinamide/Vitamin C/Vitamin E/Turmeric/Resveratrol with Hyaluronic Acid. Apply pea sized amount nightly to the entire face.  The patient was advised this is not covered by insurance since it is made by a compounding pharmacy. They will receive an email to check out and the medication will be mailed to their home.   Topical retinoid medications like tretinoin can cause dryness and irritation when first started. Only apply a pea-sized amount to the entire affected area. Avoid applying it around the eyes, edges of mouth and creases at the nose. If you experience irritation, use a good moisturizer first and/or apply the medicine less often. If you are doing well with the medicine, you can increase how often you use it until you are applying every night. Be careful with sun protection while using this medication as it can make you sensitive to the sun. This medicine should not be used by pregnant women.    Location: See attached image   Informed consent: Discussed risks (infection, pain, bleeding, bruising, swelling, allergic reaction, paralysis of nearby  muscles, eyelid droop, double vision, neck weakness, difficulty breathing, headache, undesirable cosmetic result, and need for additional treatment) and benefits of the procedure, as well as the alternatives.  Informed consent was obtained.  Preparation: The area was cleansed with alcohol.  Procedure Details:  Botox was injected into the dermis with a 30-gauge needle. Pressure applied to any bleeding. Ice packs offered for swelling.  Lot Number:  [id number] Expiration:  12/2024  Total Units Injected:  40  Plan: Tylenol may be used for headache.  Allow 2 weeks before returning to clinic for additional dosing as needed. Patient will call for any problems.   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 Condition is bothersome/symptomatic for patient. Currently flared.  Melasma is a chronic; persistent condition of hyperpigmented patches generally on the face, worse in summer due to higher UV exposure.    Heredity; thyroid disease; sun exposure; pregnancy; birth control pills; epilepsy medication and darker skin may predispose to Melasma.   Recommendations include: - Sun avoidance and daily broad spectrum (UVA/UVB) tinted mineral sunscreen SPF 30+, with Zinc or Titanium Dioxide. - Rx topical bleaching creams (i.e. hydroquinone) is a common treatment but should not be used long term.  Hydroquinones may be mixed with retinoids; vitamin C; steroids; Kojic Acid. - Alastin A-luminate, retinoids, vitamin C, topical tranexamic acid, glycolic acid and kojic acid can be used for brightening while on break from hydroquinone - Rx Azelaic Acid is also a treatment option that is safe for pregnancy (Category B). - OTC Heliocare can  be helpful in control and prevention. - Oral Rx with Tranexamic Acid 250 mg - 650 mg po daily can be used for moderate to severe cases especially during summer (contraindications include  pregnancy; lactation; hx of PE; hx of DVT; clotting disorder; heart disease; anticoagulant use and upcoming long trips)   - Chemical peels (would need multiple for best result).  - Lasers and  Microdermabrasion may also be helpful adjunct treatments.  Treatment Plan: Will prescribe [facility] Hydroquinone 12%/kojic acid/vitamin C cream pea sized amount twice daily to the entire face for up to 3 months. This cannot be used more than 3 months due to risk of exogenous ochronosis (permanent dark spots). The patient was advised this is not covered by insurance since it is made by a compounding pharmacy. They will receive an email to check out and the medication will be mailed to their home.   Start [employer] brand Tranexamic Acid toner once daily.   HYPOTRICHOSIS OF EYELIDS Bimatoprost (latisse) can rarely cause permanent darkening of the iris (colored part) of the eye. It can also irritation or darkening of the skin of the eyelids. It can also cause loss of fat in the fat pad around the eye appears to be mostly reversible if the medication is stopped. It is not recommended during pregnancy. Continue latisse nightly to eyelids  Return if symptoms worsen or fail to improve.  Anise Salvo, RMA, am acting as scribe for Darden Dates, MD .   Documentation: I have reviewed the above documentation for accuracy and completeness, and I agree with the above.  Darden Dates, MD

## 2023-02-15 ENCOUNTER — Ambulatory Visit: Payer: 59

## 2023-03-08 ENCOUNTER — Ambulatory Visit
Admission: RE | Admit: 2023-03-08 | Discharge: 2023-03-08 | Disposition: A | Discharge status: Home / Self Care | Payer: 59 | Source: Ambulatory Visit | Attending: Obstetrics and Gynecology | Admitting: Obstetrics and Gynecology

## 2023-03-08 DIAGNOSIS — Z1231 Encounter for screening mammogram for malignant neoplasm of breast: Secondary | ICD-10-CM

## 2024-01-26 ENCOUNTER — Other Ambulatory Visit: Payer: Self-pay | Admitting: Obstetrics and Gynecology

## 2024-01-26 DIAGNOSIS — Z1231 Encounter for screening mammogram for malignant neoplasm of breast: Secondary | ICD-10-CM

## 2024-03-02 ENCOUNTER — Encounter: Payer: Self-pay | Admitting: Gastroenterology

## 2024-03-08 ENCOUNTER — Ambulatory Visit
Admission: RE | Admit: 2024-03-08 | Discharge: 2024-03-08 | Disposition: A | Discharge status: Home / Self Care | Source: Ambulatory Visit | Attending: Obstetrics and Gynecology | Admitting: Obstetrics and Gynecology

## 2024-03-08 DIAGNOSIS — Z1231 Encounter for screening mammogram for malignant neoplasm of breast: Secondary | ICD-10-CM

## 2024-03-16 HISTORY — DX: Female genital prolapse, unspecified: N81.9

## 2024-03-21 ENCOUNTER — Encounter: Payer: Self-pay | Admitting: Gastroenterology

## 2024-03-22 ENCOUNTER — Encounter
Admission: RE | Disposition: A | Discharge status: Home / Self Care | Payer: Self-pay | Source: Home / Self Care | Attending: Gastroenterology

## 2024-03-22 ENCOUNTER — Other Ambulatory Visit: Payer: Self-pay

## 2024-03-22 ENCOUNTER — Ambulatory Visit
Admission: RE | Admit: 2024-03-22 | Discharge: 2024-03-22 | Disposition: A | Discharge status: Home / Self Care | Attending: Gastroenterology | Admitting: Gastroenterology

## 2024-03-22 ENCOUNTER — Ambulatory Visit: Admitting: Registered Nurse

## 2024-03-22 ENCOUNTER — Encounter: Payer: Self-pay | Admitting: Gastroenterology

## 2024-03-22 DIAGNOSIS — Z7969 Long term (current) use of other immunomodulators and immunosuppressants: Secondary | ICD-10-CM | POA: Insufficient documentation

## 2024-03-22 DIAGNOSIS — K5730 Diverticulosis of large intestine without perforation or abscess without bleeding: Secondary | ICD-10-CM | POA: Diagnosis not present

## 2024-03-22 DIAGNOSIS — K50012 Crohn's disease of small intestine with intestinal obstruction: Secondary | ICD-10-CM | POA: Diagnosis not present

## 2024-03-22 DIAGNOSIS — Z09 Encounter for follow-up examination after completed treatment for conditions other than malignant neoplasm: Secondary | ICD-10-CM | POA: Insufficient documentation

## 2024-03-22 DIAGNOSIS — K6289 Other specified diseases of anus and rectum: Secondary | ICD-10-CM | POA: Diagnosis not present

## 2024-03-22 HISTORY — PX: COLONOSCOPY: SHX5424

## 2024-03-22 HISTORY — PX: POLYPECTOMY: SHX149

## 2024-03-22 SURGERY — COLONOSCOPY
Anesthesia: General

## 2024-03-22 MED ORDER — PROPOFOL 1000 MG/100ML IV EMUL
INTRAVENOUS | Status: AC
  Filled 2024-03-22: qty 100

## 2024-03-22 MED ORDER — LIDOCAINE HCL (CARDIAC) PF 100 MG/5ML IV SOSY
PREFILLED_SYRINGE | INTRAVENOUS | Status: DC | PRN
  Administered 2024-03-22: 100 mg via INTRAVENOUS

## 2024-03-22 MED ORDER — LIDOCAINE HCL (PF) 2 % IJ SOLN
INTRAMUSCULAR | Status: AC
  Filled 2024-03-22: qty 5

## 2024-03-22 MED ORDER — PROPOFOL 500 MG/50ML IV EMUL
INTRAVENOUS | Status: DC | PRN
Start: 2024-03-22 — End: 2024-03-22
  Administered 2024-03-22: 60 mg via INTRAVENOUS
  Administered 2024-03-22: 100 ug/kg/min via INTRAVENOUS
  Administered 2024-03-22: 40 mg via INTRAVENOUS

## 2024-03-22 MED ORDER — SODIUM CHLORIDE 0.9 % IV SOLN
INTRAVENOUS | Status: DC | PRN
Start: 2024-03-22 — End: 2024-03-22

## 2024-03-22 MED ORDER — PHENYLEPHRINE 80 MCG/ML (10ML) SYRINGE FOR IV PUSH (FOR BLOOD PRESSURE SUPPORT)
PREFILLED_SYRINGE | INTRAVENOUS | Status: DC | PRN
  Administered 2024-03-22: 160 ug via INTRAVENOUS

## 2024-03-22 MED ORDER — PHENYLEPHRINE 80 MCG/ML (10ML) SYRINGE FOR IV PUSH (FOR BLOOD PRESSURE SUPPORT)
PREFILLED_SYRINGE | INTRAVENOUS | Status: AC
  Filled 2024-03-22: qty 10

## 2024-03-22 MED ORDER — SODIUM CHLORIDE 0.9 % IV SOLN: INTRAVENOUS | Status: DC

## 2024-03-22 NOTE — Interval H&P Note (Signed)
 History and Physical Interval Note: Preprocedure H&P from 03/22/24  was reviewed and there was no interval change after seeing and examining the patient.  Written consent was obtained from the patient after discussion of risks, benefits, and alternatives. Patient has consented to proceed with Colonoscopy with possible intervention   03/22/2024 7:37 AM  [name]  has presented today for surgery, with the diagnosis of Crohn's disease of both small and large intestine with fistula [K50.813].  The various methods of treatment have been discussed with the patient and family. After consideration of risks, benefits and other options for treatment, the patient has consented to  Procedure(s): COLONOSCOPY (N/A) as a surgical intervention.  The patient's history has been reviewed, patient examined, no change in status, stable for surgery.  I have reviewed the patient's chart and labs.  Questions were answered to the patient's satisfaction.     Elspeth Ozell Jungling

## 2024-03-22 NOTE — Op Note (Signed)
 [facility] Gastroenterology Patient Name: [name] Procedure Date: 03/22/2024 7:03 AM MRN: [id number] Account #: [id number] Date of Birth: 11-16-1965 Admit Type: Outpatient Age: 58 Room: [facility] ENDO ROOM 1 Gender: Female Note Status: Finalized Instrument Name: Peds Colonoscope [id number] Procedure:             Colonoscopy Indications:           Follow-up of Crohn's disease of the colon, Disease                         activity assessment of Crohn's disease of the colon Providers:             Elspeth Ozell Onita ROSALEA, DO Referring MD:          Alda Carpen (Referring MD) Medicines:             Monitored Anesthesia Care Complications:         No immediate complications. Estimated blood loss:                         Minimal. Procedure:             Pre-Anesthesia Assessment:                        - Prior to the procedure, a History and Physical was                         performed, and patient medications and allergies were                         reviewed. The patient is competent. The risks and                         benefits of the procedure and the sedation options and                         risks were discussed with the patient. All questions                         were answered and informed consent was obtained.                         Patient identification and proposed procedure were                         verified by the physician, the nurse, the anesthetist                         and the technician in the endoscopy suite. Mental                         Status Examination: alert and oriented. Airway                         Examination: normal oropharyngeal airway and neck                         mobility. Respiratory Examination: clear to  auscultation. CV Examination: RRR, no murmurs, no S3                         or S4. Prophylactic Antibiotics: The patient does not                         require prophylactic antibiotics.  Prior                         Anticoagulants: The patient has taken no anticoagulant                         or antiplatelet agents. ASA Grade Assessment: II - A                         patient with mild systemic disease. After reviewing                         the risks and benefits, the patient was deemed in                         satisfactory condition to undergo the procedure. The                         anesthesia plan was to use monitored anesthesia care                         (MAC). Immediately prior to administration of                         medications, the patient was re-assessed for adequacy                         to receive sedatives. The heart rate, respiratory                         rate, oxygen saturations, blood pressure, adequacy of                         pulmonary ventilation, and response to care were                         monitored throughout the procedure. The physical                         status of the patient was re-assessed after the                         procedure.                        After obtaining informed consent, the colonoscope was                         passed under direct vision. Throughout the procedure,                         the patient's blood pressure, pulse, and oxygen  saturations were monitored continuously. The                         Colonoscope was introduced through the anus and                         advanced to the the terminal ileum, with                         identification of the appendiceal orifice and IC                         valve. The colonoscopy was performed without                         difficulty. The patient tolerated the procedure well.                         The quality of the bowel preparation was evaluated                         using the BBPS [facility] Bowel Preparation Scale) with                         scores of: Right Colon = 2 (minor amount of residual                          staining, small fragments of stool and/or opaque                         liquid, but mucosa seen well), Transverse Colon = 3                         (entire mucosa seen well with no residual staining,                         small fragments of stool or opaque liquid) and Left                         Colon = 3 (entire mucosa seen well with no residual                         staining, small fragments of stool or opaque liquid).                         The total BBPS score equals 8. The quality of the                         bowel preparation was excellent. The terminal ileum,                         ileocecal valve, appendiceal orifice, and rectum were                         photographed. Findings:      The digital rectal exam was abnormal. Pertinent negatives include normal       sphincter tone; no abscess  or fisula.      The ileocecal valve contained a benign-appearing, intrinsic moderate       stenosis measuring less than one cm (in length) x 1.2 cm (inner       diameter) that was traversed. Estimated blood loss: none.      Localized inflammation characterized by erosions was found in the       terminal ileum. The inflammation was mild in severity. Biopsies were       taken with a cold forceps for histology. Estimated blood loss was       minimal.      The terminal ileum contained a benign-appearing, intrinsic severe       stenosis measuring unknown length that was non-traversed. Estimated       blood loss: none.      A 3 to 4 mm polyp was found in the descending colon. The polyp was       sessile. The polyp was removed with a cold snare. Resection and       retrieval were complete. Estimated blood loss was minimal.      The Simple Endoscopic Score for Crohn's Disease was determined based on       the endoscopic appearance of the mucosa in the following segments:      - Ileum: Findings include aphthous ulcers less than 0.5 cm in size, less       than 10% ulcerated surfaces, less  than 50% of surfaces affected and       multiple narrowings that can be passed. Segment score: 5.      - Right Colon: Findings include no ulcers present, no ulcerated       surfaces, no affected surfaces and no narrowings. Segment score: 0.      - Transverse Colon: Findings include no ulcers present, no ulcerated       surfaces, no affected surfaces and no narrowings. Segment score: 0.      - Left Colon: Findings include no ulcers present, no ulcerated surfaces,       no affected surfaces and no narrowings. Segment score: 0.      - Rectum: Findings include no ulcers present, no ulcerated surfaces, no       affected surfaces and no narrowings. Segment score: 0.      - Total SES-CD aggregate score: 5. Biopsies were taken with a cold       forceps for histology. Biopsies taken throughout the colon for disease       surveillance. Estimated blood loss was minimal.      The mucosa vascular pattern in the entire colon was diffusely decreased.       Estimated blood loss: none.      A few small-mouthed diverticula were found in the sigmoid colon.       Estimated blood loss: none.      The exam was otherwise without abnormality on direct and retroflexion       views. Impression:            - Abnormal digital rectal exam.                        - Stricture at the ileocecal valve.                        - Crohn's disease with ileitis. Inflammation was  found. This was mild in severity. Biopsied.                        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                        - One 3 to 4 mm polyp in the descending colon, removed                         with a cold snare. Resected and retrieved.                        - Simple Endoscopic Score for Crohn's Disease: 5,                         mucosal inflammatory changes secondary to Crohn's                         disease with ileitis. Biopsied.                        - Decreased mucosa vascular pattern in the entire                          examined colon.                        - Diverticulosis in the sigmoid colon.                        - The examination was otherwise normal on direct and                         retroflexion views. Recommendation:        - Patient has a contact number available for                         emergencies. The signs and symptoms of potential                         delayed complications were discussed with the patient.                         Return to normal activities tomorrow. Written                         discharge instructions were provided to the patient.                        - Discharge patient to home.                        - Resume previous diet.                        - Continue present medications.                        - No ibuprofen, naproxen, or other non-steroidal  anti-inflammatory drugs.                        - Continue miralax capful daily                        - Await pathology results.                        - Repeat colonoscopy in 2 years for surveillance.                        - Return to GI office as previously scheduled.                        - Recommend evaluation by IBD center (if dilation                         possible) and potentially colorectal surgery given                         significant stenosis in terminal ileum.                        Will order CRP, ESR, and fecal calprotectin.                        - The findings and recommendations were discussed with                         the patient.                        - The findings and recommendations were discussed with                         the patient's family. Procedure Code(s):     --- Professional ---                        [phone], Colonoscopy, flexible; with removal of                         tumor(s), polyp(s), or other lesion(s) by snare                         technique                        45380, 59, Colonoscopy, flexible; with biopsy, single                          or multiple Diagnosis Code(s):     --- Professional ---                        K62.89, Other specified diseases of anus and rectum                        K50.012, Crohn's disease of small intestine with                         intestinal  obstruction                        K50.112, Crohn's disease of large intestine with                         intestinal obstruction                        D12.4, Benign neoplasm of descending colon                        K57.30, Diverticulosis of large intestine without                         perforation or abscess without bleeding CPT copyright 2022 [employer]. All rights reserved. The codes documented in this report are preliminary and upon coder review may  be revised to meet current compliance requirements. Attending Participation:      I personally performed the entire procedure. Elspeth Jungling, DO Elspeth Ozell Jungling DO, DO 03/22/2024 8:20:17 AM This report has been signed electronically. Number of Addenda: 0 Note Initiated On: 03/22/2024 7:03 AM Scope Withdrawal Time: 0 hours 14 minutes 53 seconds  Total Procedure Duration: 0 hours 19 minutes 41 seconds  Estimated Blood Loss:  Estimated blood loss was minimal.      [facility]

## 2024-03-22 NOTE — Anesthesia Procedure Notes (Signed)
 Procedure Name: MAC Date/Time: 03/22/2024 7:38 AM  Performed by: Lorrene Camelia LABOR, CRNAPre-anesthesia Checklist: Patient identified, Emergency Drugs available, Suction available and Patient being monitored Patient Re-evaluated:Patient Re-evaluated prior to induction Oxygen Delivery Method: Simple face mask Preoxygenation: Pre-oxygenation with 100% oxygen Induction Type: IV induction Comments: POM

## 2024-03-22 NOTE — H&P (Signed)
 Pre-Procedure H&P   Patient ID: [name] is a 58 y.o. female.  Gastroenterology Provider: Elspeth Ozell Jungling, DO  Referring Provider: Romero Antigua, PA PCP: Alla Amis, MD  Date: 03/22/2024  HPI [name] is a 58 y.o. female who presents today for Colonoscopy for Crohn's disease .  Patient currently on Stelara.  Diagnosed with Crohn's disease is 58 years old.  Deals with constipation occasionally that is improved with MiraLAX use.  Otherwise has bowel movement daily.  Rare blood with wiping.  She has a history of fistulizing disease.  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  Ileitis was appreciated seen through the narrowing.  MRE demonstrated TI enhancement shortly thereafter   Past Medical History:  Diagnosis Date   Anemia    Crohn's disease (HCC)    Polyp of colon    Vaginal vault prolapse     Past Surgical History:  Procedure Laterality Date   ABDOMINAL HYSTERECTOMY     ANTERIOR AND POSTERIOR REPAIR N/A 11/17/2012   Procedure: ANTERIOR (CYSTOCELE) AND POSTERIOR (RECTOCELE), Enterocele repair.;  Surgeon: Charlie JINNY Flowers, MD;  Location: [facility];  Service: Gynecology;  Laterality: N/A;  Also Enterocele repair.   BILATERAL SALPINGECTOMY Bilateral 11/17/2012   Procedure: BILATERAL SALPINGECTOMY;  Surgeon: Charlie JINNY Flowers, MD;  Location: [facility];  Service: Gynecology;  Laterality: Bilateral;   BLADDER SUSPENSION N/A 11/17/2012   Procedure: TRANSVAGINAL TAPE (TVT) PROCEDURE Sling with Cystoscopy.;  Surgeon: Charlie JINNY Flowers, MD;  Location: [facility];  Service: Gynecology;  Laterality: N/A;  TVT Sling with Cystoscopy.   BREAST LUMPECTOMY WITH RADIOACTIVE SEED LOCALIZATION Left 09/05/2020   Procedure: LEFT BREAST LUMPECTOMY WITH RADIOACTIVE SEED LOCALIZATION;  Surgeon: Curvin Deward MOULD, MD;  Location: [facility];  Service: General;  Laterality: Left;   BREAST SURGERY     COLONOSCOPY N/A 02/15/2022   Procedure: COLONOSCOPY;  Surgeon: Jungling Elspeth Ozell, DO;  Location: [facility];  Service: Gastroenterology;  Laterality: N/A;   COLONOSCOPY WITH PROPOFOL  N/A 08/15/2020   Procedure: COLONOSCOPY WITH PROPOFOL ;  Surgeon: Therisa Bi, MD;  Location: [facility];  Service: Gastroenterology;  Laterality: N/A;   COLPORRHAPHY N/A    HERNIA REPAIR     MANDIBLE SURGERY     ROBOTIC ASSISTED TOTAL HYSTERECTOMY N/A 11/17/2012   Procedure: ROBOTIC ASSISTED TOTAL HYSTERECTOMY;  Surgeon: Charlie JINNY Flowers, MD;  Location: [facility];  Service: Gynecology;  Laterality: N/A;  Requests 3 1/2 hrs.    Family History No h/o GI disease or malignancy  Review of Systems  Constitutional:  Negative for activity change, appetite change, chills, diaphoresis, fatigue, fever and unexpected weight change.  HENT:  Negative for trouble swallowing and voice change.   Respiratory:  Negative for shortness of breath and wheezing.   Cardiovascular:  Negative for chest pain, palpitations and leg swelling.  Gastrointestinal:  Positive for constipation. Negative for abdominal distention, abdominal pain, anal bleeding, blood in stool, diarrhea, nausea, rectal pain and vomiting.  Musculoskeletal:  Negative for arthralgias and myalgias.  Skin:  Negative for color change and pallor.  Neurological:  Negative for dizziness, syncope and weakness.  Psychiatric/Behavioral:  Negative for confusion.   All other systems reviewed and are negative.    Medications No current facility-administered medications on file prior to encounter.   Current Outpatient Medications on File Prior  to Encounter  Medication Sig Dispense Refill   CALCIUM-VITAMIN D PO Take 1 tablet by mouth daily.     Multiple Vitamin (MULTI-VITAMIN) tablet Take by mouth.     predniSONE  (DELTASONE ) 5 MG tablet Take tablets by  mouth every morning as directed. Patient has detailed instruction sheet. 100 tablet 0   ustekinumab (STELARA) 90 MG/ML SOSY injection Inject into the skin.     bimatoprost  (LATISSE ) 0.03 % ophthalmic solution Place 1 application  into both eyes at bedtime. Place one drop on applicator and apply evenly along the skin of the upper eyelid at base of eyelashes once daily at bedtime; repeat procedure for second eye (use a clean applicator). 5 mL 12    Pertinent medications related to GI and procedure were reviewed by me with the patient prior to the procedure   Current Facility-Administered Medications:    0.9 %  sodium chloride  infusion, , Intravenous, Continuous, Onita Elspeth Sharper, DO, Last Rate: 20 mL/hr at 03/22/24 0720, New Bag at 03/22/24 0720  sodium chloride  20 mL/hr at 03/22/24 0720       Allergies  Allergen Reactions   Sulfa Antibiotics Anaphylaxis and Swelling   Allergies were reviewed by me prior to the procedure  Objective   Body mass index is 28.29 kg/m. Vitals:   03/22/24 0710  BP: (!) 117/58  Pulse: 74  Resp: 13  Temp: (!) 96.6 F (35.9 C)  TempSrc: Temporal  SpO2: 98%  Weight: 77.1 kg  Height: 5' 5 (1.651 m)     Physical Exam Vitals and nursing note reviewed.  Constitutional:      General: She is not in acute distress.    Appearance: Normal appearance. She is not ill-appearing, toxic-appearing or diaphoretic.  HENT:     Head: Normocephalic and atraumatic.     Nose: Nose normal.     Mouth/Throat:     Mouth: Mucous membranes are moist.     Pharynx: Oropharynx is clear.  Eyes:     General: No scleral icterus.    Extraocular Movements: Extraocular movements intact.  Cardiovascular:     Rate and Rhythm: Normal rate and regular rhythm.     Heart sounds: Normal heart sounds. No murmur heard.    No friction rub. No gallop.  Pulmonary:     Effort: Pulmonary effort is normal. No respiratory distress.     Breath sounds: Normal breath sounds. No  wheezing, rhonchi or rales.  Abdominal:     General: Bowel sounds are normal. There is no distension.     Palpations: Abdomen is soft.     Tenderness: There is no abdominal tenderness. There is no guarding or rebound.  Musculoskeletal:     Cervical back: Neck supple.     Right lower leg: No edema.     Left lower leg: No edema.  Skin:    General: Skin is warm and dry.     Coloration: Skin is not jaundiced or pale.  Neurological:     General: No focal deficit present.     Mental Status: She is alert and oriented to person, place, and time. Mental status is at baseline.  Psychiatric:        Mood and Affect: Mood normal.        Behavior: Behavior normal.        Thought Content: Thought content normal.        Judgment: Judgment normal.      Assessment:  Ms. LAKIYAH ARNTSON is a 58 y.o. female  who presents today for Colonoscopy for Crohn's disease .  Plan:  Colonoscopy with possible intervention today  Colonoscopy with possible biopsy, control of bleeding, polypectomy, and interventions as necessary has been discussed with the patient/patient representative. Informed consent was obtained from the patient/patient representative after explaining the indication, nature, and risks of the procedure including but not limited to death, bleeding, perforation, missed neoplasm/lesions, cardiorespiratory compromise, and reaction to medications. Opportunity for questions was given and appropriate answers were provided. Patient/patient representative has verbalized understanding is amenable to undergoing the procedure.   Elspeth Ozell Jungling, DO  [facility]  Portions of the record may have been created with voice recognition software. Occasional wrong-word or 'sound-a-like' substitutions may have occurred due to the inherent limitations of voice recognition software.  Read the chart carefully and recognize, using context, where substitutions may have occurred.

## 2024-03-22 NOTE — Anesthesia Postprocedure Evaluation (Signed)
 Anesthesia Post Note  Patient: [name]  Procedure(s) Performed: COLONOSCOPY POLYPECTOMY, INTESTINE  Patient location during evaluation: Endoscopy Anesthesia Type: General Level of consciousness: awake and alert Pain management: pain level controlled Vital Signs Assessment: post-procedure vital signs reviewed and stable Respiratory status: spontaneous breathing, nonlabored ventilation, respiratory function stable and patient connected to nasal cannula oxygen Cardiovascular status: blood pressure returned to baseline and stable Postop Assessment: no apparent nausea or vomiting Anesthetic complications: no   No notable events documented.   Last Vitals:  Vitals:   03/22/24 0808 03/22/24 0818  BP: 110/62 106/66  Pulse: 85 70  Resp: (!) 21 17  Temp: (!) 35.7 C   SpO2: 100% 100%    Last Pain:  Vitals:   03/22/24 0818  TempSrc:   PainSc: 0-No pain                 Prentice Murphy

## 2024-03-22 NOTE — Transfer of Care (Signed)
 Immediate Anesthesia Transfer of Care Note  Patient: [name]  Procedure(s) Performed: COLONOSCOPY POLYPECTOMY, INTESTINE  Patient Location: Endoscopy Unit  Anesthesia Type:General  Level of Consciousness: drowsy and patient cooperative  Airway & Oxygen Therapy: Patient Spontanous Breathing  Post-op Assessment: Report given to RN and Post -op Vital signs reviewed and stable  Post vital signs: Reviewed and stable  Last Vitals:  Vitals Value Taken Time  BP 110/62 03/22/24 08:08  Temp 35.7 C 03/22/24 08:08  Pulse 85 03/22/24 08:08  Resp 21 03/22/24 08:08  SpO2 100 % 03/22/24 08:08    Last Pain:  Vitals:   03/22/24 0808  TempSrc: Temporal  PainSc: Asleep         Complications: No notable events documented.

## 2024-03-22 NOTE — Anesthesia Preprocedure Evaluation (Signed)
 Anesthesia Evaluation  Patient identified by MRN, date of birth, ID band Patient awake    Reviewed: Allergy & Precautions, NPO status , Patient's Chart, lab work & pertinent test results  History of Anesthesia Complications Negative for: history of anesthetic complications  Airway Mallampati: III  TM Distance: <3 FB Neck ROM: full    Dental  (+) Chipped, Dental Advidsory Given   Pulmonary neg pulmonary ROS, neg shortness of breath   Pulmonary exam normal        Cardiovascular Exercise Tolerance: Good (-) angina (-) Past MI and (-) DOE negative cardio ROS Normal cardiovascular exam     Neuro/Psych neg Seizures  Neuromuscular disease  negative psych ROS   GI/Hepatic Neg liver ROS,neg GERD  ,,Crohn's Dz   Endo/Other  negative endocrine ROS    Renal/GU negative Renal ROS  negative genitourinary   Musculoskeletal   Abdominal   Peds  Hematology negative hematology ROS (+)   Anesthesia Other Findings Past Medical History: No date: Anemia No date: Crohn's disease (HCC) No date: Polyp of colon  Past Surgical History: No date: ABDOMINAL HYSTERECTOMY 11/17/2012: ANTERIOR AND POSTERIOR REPAIR; N/A     Comment:  Procedure: ANTERIOR (CYSTOCELE) AND POSTERIOR               (RECTOCELE), Enterocele repair.;  Surgeon: Charlie JINNY Flowers, MD;  Location: [facility];  Service: Gynecology;                Laterality: N/A;  Also Enterocele repair. 11/17/2012: BILATERAL SALPINGECTOMY; Bilateral     Comment:  Procedure: BILATERAL SALPINGECTOMY;  Surgeon: Charlie JINNY Flowers, MD;  Location: [facility];  Service: Gynecology;                Laterality: Bilateral; 11/17/2012: BLADDER SUSPENSION; N/A     Comment:  Procedure: TRANSVAGINAL TAPE (TVT) PROCEDURE Sling with               Cystoscopy.;  Surgeon: Charlie JINNY Flowers, MD;  Location:               [facility];  Service: Gynecology;  Laterality: N/A;  TVT               Sling  with Cystoscopy. 09/05/2020: BREAST LUMPECTOMY WITH RADIOACTIVE SEED LOCALIZATION; Left     Comment:  Procedure: LEFT BREAST LUMPECTOMY WITH RADIOACTIVE SEED               LOCALIZATION;  Surgeon: Curvin Deward MOULD, MD;  Location:               [facility];  Service: General;                Laterality: Left; No date: BREAST SURGERY 08/15/2020: COLONOSCOPY WITH PROPOFOL ; N/A     Comment:  Procedure: COLONOSCOPY WITH PROPOFOL ;  Surgeon: Therisa Bi, MD;  Location: [facility];  Service:               Gastroenterology;  Laterality: N/A; No date: MANDIBLE SURGERY 11/17/2012: ROBOTIC ASSISTED TOTAL HYSTERECTOMY; N/A     Comment:  Procedure: ROBOTIC ASSISTED TOTAL HYSTERECTOMY;                Surgeon: Charlie JINNY Flowers, MD;  Location: [facility];  Service: Gynecology;  Laterality: N/A;  Requests 3 1/2               hrs.  BMI    Body Mass Index: 26.79 kg/m      Reproductive/Obstetrics negative OB ROS                              Anesthesia Physical Anesthesia Plan  ASA: 2  Anesthesia Plan: General   Post-op Pain Management:    Induction: Intravenous  PONV Risk Score and Plan: Propofol  infusion and TIVA  Airway Management Planned: Natural Airway and Nasal Cannula  Additional Equipment:   Intra-op Plan:   Post-operative Plan:   Informed Consent: I have reviewed the patients History and Physical, chart, labs and discussed the procedure including the risks, benefits and alternatives for the proposed anesthesia with the patient or authorized representative who has indicated his/her understanding and acceptance.     Dental Advisory Given  Plan Discussed with: Anesthesiologist, CRNA and Surgeon  Anesthesia Plan Comments: (Patient consented for risks of anesthesia including but not limited to:  - adverse reactions to medications - risk of airway placement if required - damage to eyes, teeth, lips or other oral  mucosa - nerve damage due to positioning  - sore throat or hoarseness - Damage to heart, brain, nerves, lungs, other parts of body or loss of life  Patient voiced understanding.)         Anesthesia Quick Evaluation

## 2024-03-23 ENCOUNTER — Encounter: Payer: Self-pay | Admitting: Gastroenterology

## 2024-03-23 LAB — SURGICAL PATHOLOGY

## 2024-04-30 ENCOUNTER — Ambulatory Visit

## 2024-05-28 ENCOUNTER — Ambulatory Visit (INDEPENDENT_AMBULATORY_CARE_PROVIDER_SITE_OTHER)

## 2024-05-28 DIAGNOSIS — D1801 Hemangioma of skin and subcutaneous tissue: Secondary | ICD-10-CM | POA: Diagnosis not present

## 2024-05-28 DIAGNOSIS — L988 Other specified disorders of the skin and subcutaneous tissue: Secondary | ICD-10-CM

## 2024-05-28 DIAGNOSIS — L821 Other seborrheic keratosis: Secondary | ICD-10-CM

## 2024-05-28 DIAGNOSIS — D229 Melanocytic nevi, unspecified: Secondary | ICD-10-CM

## 2024-05-28 DIAGNOSIS — L814 Other melanin hyperpigmentation: Secondary | ICD-10-CM | POA: Diagnosis not present

## 2024-05-28 DIAGNOSIS — Z1283 Encounter for screening for malignant neoplasm of skin: Secondary | ICD-10-CM | POA: Diagnosis not present

## 2024-05-28 DIAGNOSIS — L578 Other skin changes due to chronic exposure to nonionizing radiation: Secondary | ICD-10-CM

## 2024-05-28 DIAGNOSIS — L609 Nail disorder, unspecified: Secondary | ICD-10-CM

## 2024-05-28 DIAGNOSIS — W908XXA Exposure to other nonionizing radiation, initial encounter: Secondary | ICD-10-CM

## 2024-05-28 DIAGNOSIS — L811 Chloasma: Secondary | ICD-10-CM

## 2024-05-28 MED ORDER — HYDROQUINONE 4 % EX CREA: TOPICAL_CREAM | Freq: Two times a day (BID) | CUTANEOUS | 5 refills | Status: AC

## 2024-05-28 NOTE — Progress Notes (Signed)
 Subjective   [name] is a 58 y.o. female who presents for the following: Total body skin exam for skin cancer screening and mole check. The patient has spots, moles and lesions to be evaluated, some may be new or changing and the patient may have concern these could be cancer.. Patient is established patient   Today patient reports: Area of concern on her face Areas of concern on bilateral hands  Requesting refill of [facility] compounded Hydroquinone cream  Review of Systems:    No other skin or systemic complaints except as noted in HPI or Assessment and Plan.  The following portions of the chart were reviewed this encounter and updated as appropriate: medications, allergies, medical history  Relevant Medical History:  Crohn's disease - stable on Stelara injections  Objective  Well appearing patient in no apparent distress; mood and affect are within normal limits. Examination was performed of the: Full Skin Examination: scalp, head, eyes, ears, nose, lips, neck, chest, axillae, abdomen, back, buttocks, bilateral upper extremities, bilateral lower extremities, hands, feet, fingers, toes, fingernails, and toenails.   Examination notable for: SKIN EXAM, Angioma(s): Scattered red vascular papule(s)  , Lentigo/lentigines: Scattered pigmented macules that are tan to brown in color and are somewhat non-uniform in shape and concentrated in the sun-exposed areas, Nevus/nevi: Scattered well-demarcated, regular, pigmented macule(s) and/or papule(s)  , Seborrheic Keratosis(es): Stuck-on appearing keratotic papule(s) on the trunk, some  irritated with redness, crusting, edema, and/or partial avulsion, Actinic Damage/Elastosis: chronic sun damage: dyspigmentation, telangiectasia, and wrinkling Left great toe with thickening   Examination limited by: Undergarments and Patient deferred removal       Assessment & Plan   SKIN CANCER SCREENING PERFORMED TODAY.  BENIGN SKIN FINDINGS  -  Lentigines  - Seborrheic keratoses  - Hemangiomas   - Nevus/Multiple Benign Nevi - Reassurance provided regarding the benign appearance of lesions noted on exam today; no treatment is indicated in the absence of symptoms/changes. - Reinforced importance of photoprotective strategies including liberal and frequent sunscreen use of a broad-spectrum SPF 30 or greater, use of protective clothing, and sun avoidance for prevention of cutaneous malignancy and photoaging.  Counseled patient on the importance of regular self-skin monitoring as well as routine clinical skin examinations as scheduled.   ACTINIC DAMAGE - Chronic condition, secondary to cumulative UV/sun exposure - Recommend daily broad spectrum sunscreen SPF 30+ to sun-exposed areas, reapply every 2 hours as needed.  - Staying in the shade or wearing long sleeves, sun glasses (UVA+UVB protection) and wide brim hats (4-inch brim around the entire circumference of the hat) are also recommended for sun protection.  - Call for new or changing lesions.  Nail Dystrophy vs Superficial fungus  - Start OTC Lamisil and recommend spraying shoes with OTC antifungal spray  Melasma Elastosis  - stable chronic conditions  - explained the etiology of the disorder, its difficulty to treat, and chronic nature - Strict sun protection with sun avoidance and tinted broad-spectrum sunscreen (with visible light protection), ideally SPF 50+, but at least 30+ (provided handout with recommendations). Warned that even 10 minutes of unprotected sun exposure while clear can flare melasma - Counseled that this is a chronic condition that will likely resolve in later years of life - Counseled on goals of treatment - primarily prevention of flares and minimizing pigmentation, not necessarily complete clearance - recommended diligent sun protection - start compounded topical medication (Hydroquinone 8%, Tretinoin 0.025%, Kojic Acid 1%, Niacinamide 4%, Fluocinolone  0.025% Cream) from  [facility]  Level of service outlined above   Procedures, orders, diagnosis for this visit:    There are no diagnoses linked to this encounter.  Return to clinic: Return in about 1 year (around 05/28/2025) for TBSE.  I, Emerick Ege, CMA am acting as scribe for Lauraine JAYSON Kanaris, MD.   Documentation: I have reviewed the above documentation for accuracy and completeness, and I agree with the above.  Lauraine JAYSON Kanaris, MD

## 2024-05-28 NOTE — Patient Instructions (Signed)
 Sunscreen  Who needs sunscreen? Everyone. Sunscreen use can help prevent skin cancer by protecting you from the sun's harmful ultraviolet rays. Anyone can get skin cancer, regardless of age, gender or race. In fact, it is estimated that one in five Americans will develop skin cancer in their lifetime.  Sunscreen alone cannot fully protect you. In addition to wearing sunscreen, dermatologists recommend taking the following steps to protect your skin and find skin cancer early:  Seek shade when appropriate, remembering that the sun's rays are strongest between 10 a.m. and 2 p.m. If your shadow is shorter than you are, seek shade. Dress to protect yourself from the sun by wearing a lightweight long-sleeved shirt, pants, a wide-brimmed hat and sunglasses, when possible.  Use extra caution near water , snow and sand as they reflect the damaging rays of the sun, which can increase your chance of sunburn.  Get vitamin D safely through a healthy diet that may include vitamin supplements. Don't seek the sun. Avoid tanning beds. Ultraviolet light from the sun and tanning beds can cause skin cancer and wrinkling. If you want to look tan, you may wish to use a self-tanning product, but continue to use sunscreen with it.  When should I use sunscreen? Every day you go outside--even if you're just walking to and from your form of transportation. The sun emits harmful UV rays year-round. Even on cloudy days, up to 80 percent of the sun's harmful UV rays can penetrate your skin. Snow, sand and water  increase the need for sunscreen because they reflect the sun's rays.  How much sunscreen should I use, and how often should I apply it? Most people only apply 25-50 percent of the recommended amount of sunscreen. Apply enough sunscreen to cover all exposed skin. Most adults need about 1 ounce -- or enough to fill a shot glass -- to fully cover their body.  Don't forget to apply to the tops of your feet, your neck,  your ears and the top of your head. Apply sunscreen to dry skin 15 minutes before going outdoors.  Skin cancer also can form on the lips. To protect your lips, apply a lip balm or lipstick that contains sunscreen with an SPF of 30 or higher.  When outdoors, reapply sunscreen approximately every two hours, or after swimming or sweating, according to the directions on the bottle.   Broad-spectrum sunscreens protect against both UVA and UVB rays. What is the difference between the rays? Sunlight consists of two types of harmful rays that reach the earth -- UVA rays and UVB rays. Overexposure to either can lead to skin cancer. In addition to causing skin cancer, here's what each of these rays do:  UVA rays (or aging rays) can prematurely age your skin, causing wrinkles and age spots, and can pass through window glass. UVB rays (or burning rays) are the primary cause of sunburn and are blocked by window glass  There is no safe way to tan. Every time you tan, you damage your skin. As this damage builds, you speed up the aging of your skin and increase your risk for all types of skin cancer.  What is the difference between chemical and physical sunscreens? Chemical sunscreens work like a sponge, absorbing the sun's rays. They contain one or more of the following active ingredients: oxybenzone, avobenzone, octisalate, octocrylene, homosalate and octinoxate. These formulations tend to be easier to rub into the skin without leaving a white residue.   Physical sunscreens work like  a shield, sitting sit on the surface of your skin and deflecting the sun's rays. They contain the active ingredients zinc oxide and/or titanium dioxide. Use this sunscreen if you have sensitive skin.   What type of sunscreen should I use? The best type of sunscreen is the one you will use again and again. Just make sure it offers broad-spectrum (UVA and UVB) protection, has an SPF of 30+, and is water -resistant. The kind of sunscreen  you use is a matter of personal choice, and may vary depending on the area of the body to be protected. Available sunscreen options include lotions, creams, gels, ointments, wax sticks and sprays.  Recommended physical sunscreens for face: - Neutrogena Sheer Zinc - Aveeno Positively Mineral Sensitive - CeraVe Hydrating Mineral (also has a tinted version) - La Roche-Posay Anthelios Mineral Face (comes as a cream, lotion, light fluid, and there is also a tinted version).  - EltaMD UV Clear (also has a tinted version)  Recommended physical sunscreens for body: - Neutrogena Sheer Zinc Dry-Touch Sunscreen Sensitive Skin Lotion Broad Spectrum SPF 50 - Aveeno Positively Mineral Sensitive Skin Sunscreen Broad Spectrum SPF 50 - La Roche-Posay Anthelios SPF 50 Mineral Sunscreen - Gentle Lotion - CeraVe Hydrating Mineral Sunscreen SPF 50  Recommended chemical sunscreens for face: - Anthelios UV Correct Face Sunscreen SPF 70 with Niacinamide - Neutrogena Clear Face Oil-Free SPF 50 with Helioplex - Neutrogena Sport Face Oil-Free SPF 70+ with Helioplex - Aveeno Protect + Hydrate Sunscreen For Face SPF 70 - La Roche-Posay Anthelios Light Fluid Sunscreen for Face SPF 60  Recommended chemical sunscreens for body: - Neutrogena Ultra Sheer Dry-Touch Sunscreen SPF 70 - Aveeno Protect + Hydrate Broad Spectrum All-Day Hydration SPF 60 (comes in a big pump) - La Roche-Posay Anthelios Melt-In Milk Sunscreen SPF 60    Melanoma ABCDEs  Melanoma is the most dangerous type of skin cancer, and is the leading cause of death from skin disease.  You are more likely to develop melanoma if you: Have light-colored skin, light-colored eyes, or red or blond hair Spend a lot of time in the sun Tan regularly, either outdoors or in a tanning bed Have had blistering sunburns, especially during childhood Have a close family member who has had a melanoma Have atypical moles or large birthmarks  Early detection of  melanoma is key since treatment is typically straightforward and cure rates are extremely high if we catch it early.   The first sign of melanoma is often a change in a mole or a new dark spot.  The ABCDE system is a way of remembering the signs of melanoma.  A for asymmetry:  The two halves do not match. B for border:  The edges of the growth are irregular. C for color:  A mixture of colors are present instead of an even brown color. D for diameter:  Melanomas are usually (but not always) greater than 6mm - the size of a pencil eraser. E for evolution:  The spot keeps changing in size, shape, and color.  Please check your skin once per month between visits. You can use a small mirror in front and a large mirror behind you to keep an eye on the back side or your body.   If you see any new or changing lesions before your next follow-up, please call to schedule a visit.  Please continue daily skin protection including broad spectrum sunscreen SPF 30+ to sun-exposed areas, reapplying every 2 hours as needed when you're outdoors.  Due to recent changes in healthcare laws, you may see results of your pathology and/or laboratory studies on MyChart before the doctors have had a chance to review them. We understand that in some cases there may be results that are confusing or concerning to you. Please understand that not all results are received at the same time and often the doctors may need to interpret multiple results in order to provide you with the best plan of care or course of treatment. Therefore, we ask that you please give us  2 business days to thoroughly review all your results before contacting the office for clarification. Should we see a critical lab result, you will be contacted sooner.   If You Need Anything After Your Visit  If you have any questions or concerns for your doctor, please call our main line at [phone] and press option 4 to reach your doctor's medical assistant.  If no one answers, please leave a voicemail as directed and we will return your call as soon as possible. Messages left after 4 pm will be answered the following business day.   You may also send us  a message via MyChart. We typically respond to MyChart messages within 1-2 business days.  For prescription refills, please ask your pharmacy to contact our office. Our fax number is [phone].  If you have an urgent issue when the clinic is closed that cannot wait until the next business day, you can page your doctor at the number below.    Please note that while we do our best to be available for urgent issues outside of office hours, we are not available 24/7.   If you have an urgent issue and are unable to reach us , you may choose to seek medical care at your doctor's office, retail clinic, urgent care center, or emergency room.  If you have a medical emergency, please immediately call 911 or go to the emergency department.  Pager Numbers  - Dr. Hester: [phone]  - Dr. Jackquline: [phone]  - Dr. Claudene: [phone]   - Dr. Raymund: [phone]  In the event of inclement weather, please call our main line at [phone] for an update on the status of any delays or closures.  Dermatology Medication Tips: Please keep the boxes that topical medications come in in order to help keep track of the instructions about where and how to use these. Pharmacies typically print the medication instructions only on the boxes and not directly on the medication tubes.   If your medication is too expensive, please contact our office at [phone] option 4 or send us  a message through MyChart.   We are unable to tell what your co-pay for medications will be in advance as this is different depending on your insurance coverage. However, we may be able to find a substitute medication at lower cost or fill out paperwork to get insurance to cover a needed medication.   If a prior authorization is  required to get your medication covered by your insurance company, please allow us  1-2 business days to complete this process.  Drug prices often vary depending on where the prescription is filled and some pharmacies may offer cheaper prices.  The website www.goodrx.com contains coupons for medications through different pharmacies. The prices here do not account for what the cost may be with help from insurance (it may be cheaper with your insurance), but the website can give you the price if you did not use any insurance.  - You can print  the associated coupon and take it with your prescription to the pharmacy.  - You may also stop by our office during regular business hours and pick up a GoodRx coupon card.  - If you need your prescription sent electronically to a different pharmacy, notify our office through [facility] or by phone at [phone] option 4.     Si Usted Necesita Algo Despus de Su Visita  Tambin puede enviarnos un mensaje a travs de Clinical cytogeneticist. Por lo general respondemos a los mensajes de MyChart en el transcurso de 1 a 2 das hbiles.  Para renovar recetas, por favor pida a su farmacia que se ponga en contacto con nuestra oficina. Randi lakes de fax es [place] [phone].  Si tiene un asunto urgente cuando la clnica est cerrada y que no puede esperar hasta el siguiente da hbil, puede llamar/localizar a su doctor(a) al nmero que aparece a continuacin.   Por favor, tenga en cuenta que aunque hacemos todo lo posible para estar disponibles para asuntos urgentes fuera del horario de [place], no estamos disponibles las 24 horas del da, los 7 [address]  [address] de la [place].   Si tiene un problema urgente y no puede comunicarse con nosotros, puede optar por buscar atencin mdica  en el consultorio de su doctor(a), en una clnica privada, en un centro de atencin urgente o en una sala de emergencias.  Si tiene Engineer, drilling, por favor llame inmediatamente al 911 o vaya a  la sala de emergencias.  Nmeros de bper  - Dr. Hester: [phone]  - Dra. Jackquline: [phone]  - Dr. Claudene: [phone]  - Dra. Kitts: [phone]  En caso de inclemencias del [place], por favor llame a nuestra lnea principal al [phone] para una actualizacin sobre el estado de cualquier retraso o cierre.  Consejos para la medicacin en dermatologa: Por favor, guarde las cajas en las que vienen los medicamentos de uso tpico para ayudarle a seguir las instrucciones sobre dnde y cmo usarlos. Las farmacias generalmente imprimen las instrucciones del medicamento slo en las cajas y no directamente en los tubos del [place].   Si su medicamento es muy caro, por favor, pngase en contacto con landry rieger llamando al [phone] y presione la opcin 4 o envenos un mensaje a travs de Clinical cytogeneticist.   No podemos decirle cul ser su copago por los medicamentos por adelantado ya que esto es diferente dependiendo de la cobertura de su seguro. Sin embargo, es posible que podamos encontrar un medicamento sustituto a Audiological scientist un formulario para que el seguro cubra el medicamento que se considera necesario.   Si se requiere una autorizacin previa para que su compaa de seguros malta su medicamento, por favor permtanos de 1 a 2 das hbiles para completar este proceso.  Los precios de los medicamentos varan con frecuencia dependiendo del Environmental consultant de dnde se surte la receta y alguna farmacias pueden ofrecer precios ms baratos.  El sitio web www.goodrx.com tiene cupones para medicamentos de Health and safety inspector. Los precios aqu no tienen en cuenta lo que podra costar con la ayuda del seguro (puede ser ms barato con su seguro), pero el sitio web puede darle el precio si no utiliz Tourist information centre manager.  - Puede imprimir el cupn correspondiente y llevarlo con su receta a la farmacia.  - Tambin puede pasar por nuestra oficina durante el horario de atencin regular y Education officer, museum  una tarjeta de cupones de GoodRx.  - Si necesita que su receta se enve electrnicamente a ignacia bender  diferente, informe a nuestra oficina a travs de MyChart de [place] o por telfono llamando al [phone] y presione la opcin 4.    Due to recent changes in healthcare laws, you may see results of your pathology and/or laboratory studies on MyChart before the doctors have had a chance to review them. We understand that in some cases there may be results that are confusing or concerning to you. Please understand that not all results are received at the same time and often the doctors may need to interpret multiple results in order to provide you with the best plan of care or course of treatment. Therefore, we ask that you please give us  2 business days to thoroughly review all your results before contacting the office for clarification. Should we see a critical lab result, you will be contacted sooner.   If You Need Anything After Your Visit  If you have any questions or concerns for your doctor, please call our main line at [phone] and press option 4 to reach your doctor's medical assistant. If no one answers, please leave a voicemail as directed and we will return your call as soon as possible. Messages left after 4 pm will be answered the following business day.   You may also send us  a message via MyChart. We typically respond to MyChart messages within 1-2 business days.  For prescription refills, please ask your pharmacy to contact our office. Our fax number is [phone].  If you have an urgent issue when the clinic is closed that cannot wait until the next business day, you can page your doctor at the number below.    Please note that while we do our best to be available for urgent issues outside of office hours, we are not available 24/7.   If you have an urgent issue and are unable to reach us , you may choose to seek medical care at your doctor's office, retail clinic,  urgent care center, or emergency room.  If you have a medical emergency, please immediately call 911 or go to the emergency department.  Pager Numbers  - Dr. Hester: [phone]  - Dr. Jackquline: [phone]  - Dr. Claudene: [phone]   - Dr. Raymund: [phone]  In the event of inclement weather, please call our main line at [phone] for an update on the status of any delays or closures.  Dermatology Medication Tips: Please keep the boxes that topical medications come in in order to help keep track of the instructions about where and how to use these. Pharmacies typically print the medication instructions only on the boxes and not directly on the medication tubes.   If your medication is too expensive, please contact our office at [phone] option 4 or send us  a message through MyChart.   We are unable to tell what your co-pay for medications will be in advance as this is different depending on your insurance coverage. However, we may be able to find a substitute medication at lower cost or fill out paperwork to get insurance to cover a needed medication.   If a prior authorization is required to get your medication covered by your insurance company, please allow us  1-2 business days to complete this process.  Drug prices often vary depending on where the prescription is filled and some pharmacies may offer cheaper prices.  The website www.goodrx.com contains coupons for medications through different pharmacies. The prices here do not account for what the cost may be with help from insurance (it may  be cheaper with your insurance), but the website can give you the price if you did not use any insurance.  - You can print the associated coupon and take it with your prescription to the pharmacy.  - You may also stop by our office during regular business hours and pick up a GoodRx coupon card.  - If you need your prescription sent electronically to a different pharmacy, notify our  office through [facility] or by phone at [phone] option 4.     Si Usted Necesita Algo Despus de Su Visita  Tambin puede enviarnos un mensaje a travs de Clinical cytogeneticist. Por lo general respondemos a los mensajes de MyChart en el transcurso de 1 a 2 das hbiles.  Para renovar recetas, por favor pida a su farmacia que se ponga en contacto con nuestra oficina. Randi lakes de fax es [place] [phone].  Si tiene un asunto urgente cuando la clnica est cerrada y que no puede esperar hasta el siguiente da hbil, puede llamar/localizar a su doctor(a) al nmero que aparece a continuacin.   Por favor, tenga en cuenta que aunque hacemos todo lo posible para estar disponibles para asuntos urgentes fuera del horario de [place], no estamos disponibles las 24 horas del da, los 7 [address]  [address] de la [place].   Si tiene un problema urgente y no puede comunicarse con nosotros, puede optar por buscar atencin mdica  en el consultorio de su doctor(a), en una clnica privada, en un centro de atencin urgente o en una sala de emergencias.  Si tiene Engineer, drilling, por favor llame inmediatamente al 911 o vaya a la sala de emergencias.  Nmeros de bper  - Dr. Hester: [phone]  - Dra. Jackquline: [phone]  - Dr. Claudene: [phone]  - Dra. Kitts: [phone]  En caso de inclemencias del [place], por favor llame a nuestra lnea principal al [phone] para una actualizacin sobre el estado de cualquier retraso o cierre.  Consejos para la medicacin en dermatologa: Por favor, guarde las cajas en las que vienen los medicamentos de uso tpico para ayudarle a seguir las instrucciones sobre dnde y cmo usarlos. Las farmacias generalmente imprimen las instrucciones del medicamento slo en las cajas y no directamente en los tubos del [place].   Si su medicamento es muy caro, por favor, pngase en contacto con landry rieger llamando al [phone] y presione la opcin 4 o envenos un  mensaje a travs de Clinical cytogeneticist.   No podemos decirle cul ser su copago por los medicamentos por adelantado ya que esto es diferente dependiendo de la cobertura de su seguro. Sin embargo, es posible que podamos encontrar un medicamento sustituto a Audiological scientist un formulario para que el seguro cubra el medicamento que se considera necesario.   Si se requiere una autorizacin previa para que su compaa de seguros malta su medicamento, por favor permtanos de 1 a 2 das hbiles para completar este proceso.  Los precios de los medicamentos varan con frecuencia dependiendo del Environmental consultant de dnde se surte la receta y alguna farmacias pueden ofrecer precios ms baratos.  El sitio web www.goodrx.com tiene cupones para medicamentos de Health and safety inspector. Los precios aqu no tienen en cuenta lo que podra costar con la ayuda del seguro (puede ser ms barato con su seguro), pero el sitio web puede darle el precio si no utiliz Tourist information centre manager.  - Puede imprimir el cupn correspondiente y llevarlo con su receta a la farmacia.  - Tambin puede pasar por nuestra oficina durante el  horario de Visual merchandiser regular y Education officer, museum una tarjeta de cupones de GoodRx.  - Si necesita que su receta se enve electrnicamente a una farmacia diferente, informe a nuestra oficina a travs de MyChart de [place] o por telfono llamando al [phone] y presione la opcin 4.

## 2025-05-27 ENCOUNTER — Ambulatory Visit
